# Patient Record
Sex: Male | Born: 1940 | Race: Black or African American | Hispanic: No | Marital: Married | Smoking: Former smoker
Health system: Southern US, Community
[De-identification: ages and names within clinical notes are randomized; demographics above are authoritative.]

## PROBLEM LIST (undated history)

## (undated) DIAGNOSIS — J449 Chronic obstructive pulmonary disease, unspecified: Secondary | ICD-10-CM

## (undated) DIAGNOSIS — E785 Hyperlipidemia, unspecified: Secondary | ICD-10-CM

## (undated) DIAGNOSIS — S065X9A Traumatic subdural hemorrhage with loss of consciousness of unspecified duration, initial encounter: Secondary | ICD-10-CM

## (undated) DIAGNOSIS — N183 Chronic kidney disease, stage 3 unspecified: Secondary | ICD-10-CM

## (undated) DIAGNOSIS — I219 Acute myocardial infarction, unspecified: Secondary | ICD-10-CM

## (undated) DIAGNOSIS — I2510 Atherosclerotic heart disease of native coronary artery without angina pectoris: Secondary | ICD-10-CM

## (undated) DIAGNOSIS — R519 Headache, unspecified: Secondary | ICD-10-CM

## (undated) DIAGNOSIS — I10 Essential (primary) hypertension: Secondary | ICD-10-CM

## (undated) DIAGNOSIS — M109 Gout, unspecified: Secondary | ICD-10-CM

## (undated) DIAGNOSIS — E119 Type 2 diabetes mellitus without complications: Secondary | ICD-10-CM

## (undated) DIAGNOSIS — C61 Malignant neoplasm of prostate: Secondary | ICD-10-CM

## (undated) DIAGNOSIS — M5432 Sciatica, left side: Secondary | ICD-10-CM

## (undated) DIAGNOSIS — M21372 Foot drop, left foot: Secondary | ICD-10-CM

## (undated) DIAGNOSIS — R51 Headache: Secondary | ICD-10-CM

## (undated) HISTORY — DX: Chronic obstructive pulmonary disease, unspecified: J44.9

## (undated) HISTORY — PX: CORONARY ANGIOPLASTY WITH STENT PLACEMENT: SHX49

## (undated) HISTORY — PX: CYSTOSCOPY WITH FULGERATION: SHX6638

## (undated) HISTORY — DX: Atherosclerotic heart disease of native coronary artery without angina pectoris: I25.10

## (undated) HISTORY — DX: Hyperlipidemia, unspecified: E78.5

## (undated) HISTORY — DX: Acute myocardial infarction, unspecified: I21.9

## (undated) HISTORY — DX: Gout, unspecified: M10.9

---

## 2005-02-27 DIAGNOSIS — C61 Malignant neoplasm of prostate: Secondary | ICD-10-CM

## 2005-02-27 HISTORY — PX: PROSTATECTOMY: SHX69

## 2005-02-27 HISTORY — DX: Malignant neoplasm of prostate: C61

## 2015-02-28 HISTORY — PX: CATARACT EXTRACTION W/ INTRAOCULAR LENS  IMPLANT, BILATERAL: SHX1307

## 2015-05-07 ENCOUNTER — Other Ambulatory Visit: Payer: Self-pay | Admitting: Gastroenterology

## 2015-05-07 DIAGNOSIS — R634 Abnormal weight loss: Secondary | ICD-10-CM

## 2015-05-19 ENCOUNTER — Ambulatory Visit
Admission: RE | Admit: 2015-05-19 | Discharge: 2015-05-19 | Disposition: A | Discharge status: Home / Self Care | Payer: Medicare Other | Source: Ambulatory Visit | Attending: Gastroenterology | Admitting: Gastroenterology

## 2015-05-19 DIAGNOSIS — R634 Abnormal weight loss: Secondary | ICD-10-CM

## 2016-04-12 ENCOUNTER — Encounter: Payer: Self-pay | Admitting: Physical Therapy

## 2016-04-12 ENCOUNTER — Ambulatory Visit: Payer: Medicare Other | Attending: Gastroenterology | Admitting: Physical Therapy

## 2016-04-12 DIAGNOSIS — M6281 Muscle weakness (generalized): Secondary | ICD-10-CM | POA: Insufficient documentation

## 2016-04-12 DIAGNOSIS — M62838 Other muscle spasm: Secondary | ICD-10-CM | POA: Diagnosis present

## 2016-04-12 NOTE — Patient Instructions (Signed)
Toileting Techniques for Bowel Movements (Defecation) Using your belly (abdomen) and pelvic floor muscles to have a bowel movement is usually instinctive.  Sometimes people can have problems with these muscles and have to relearn proper defecation (emptying) techniques.  If you have weakness in your muscles, organs that are falling out, decreased sensation in your pelvis, or ignore your urge to go, you may find yourself straining to have a bowel movement.  You are straining if you are: . holding your breath or taking in a huge gulp of air and holding it  . keeping your lips and jaw tensed and closed tightly . turning red in the face because of excessive pushing or forcing . developing or worsening your  hemorrhoids . getting faint while pushing . not emptying completely and have to defecate many times a day  If you are straining, you are actually making it harder for yourself to have a bowel movement.  Many people find they are pulling up with the pelvic floor muscles and closing off instead of opening the anus. Due to lack pelvic floor relaxation and coordination the abdominal muscles, one has to work harder to push the feces out.  Many people have never been taught how to defecate efficiently and effectively.  Notice what happens to your body when you are having a bowel movement.  While you are sitting on the toilet pay attention to the following areas: . Jaw and mouth position . Angle of your hips   . Whether your feet touch the ground or not . Arm placement  . Spine position . Waist . Belly tension . Anus (opening of the anal canal)  An Evacuation/Defecation Plan   Here are the 4 basic points:  1. Lean forward enough for your elbows to rest on your knees 2. Support your feet on the floor or use a low stool if your feet don't touch the floor  3. Push out your belly as if you have swallowed a beach ball-you should feel a widening of your waist 4. Open and relax your pelvic floor muscles,  rather than tightening around the anus      The following conditions my require modifications to your toileting posture:  . If you have had surgery in the past that limits your back, hip, pelvic, knee or ankle flexibility . Constipation   Your healthcare practitioner may make the following additional suggestions and adjustments:  1) Sit on the toilet  a) Make sure your feet are supported. b) Notice your hip angle and spine position-most people find it effective to lean forward or raise their knees, which can help the muscles around the anus to relax  c) When you lean forward, place your forearms on your thighs for support  2) Relax suggestions a) Breath deeply in through your nose and out slowly through your mouth as if you are smelling the flowers and blowing out the candles. b) To become aware of how to relax your muscles, contracting and releasing muscles can be helpful.  Pull your pelvic floor muscles in tightly by using the image of holding back gas, or closing around the anus (visualize making a circle smaller) and lifting the anus up and in.  Then release the muscles and your anus should drop down and feel open. Repeat 5 times ending with the feeling of relaxation. c) Keep your pelvic floor muscles relaxed; let your belly bulge out. d) The digestive tract starts at the mouth and ends at the anal opening, so be   sure to relax both ends of the tube.  Place your tongue on the roof of your mouth with your teeth separated.  This helps relax your mouth and will help to relax the anus at the same time.  3) Empty (defecation) a) Keep your pelvic floor and sphincter relaxed, then bulge your anal muscles.  Make the anal opening wide.  b) Stick your belly out as if you have swallowed a beach ball. c) Make your belly wall hard using your belly muscles while continuing to breathe. Doing this makes it easier to open your anus. d) Breath out and give a grunt (or try using other sounds such as  ahhhh, shhhhh, ohhhh or grrrrrrr).  4) Finish a) As you finish your bowel movement, pull the pelvic floor muscles up and in.  This will leave your anus in the proper place rather than remaining pushed out and down. If you leave your anus pushed out and down, it will start to feel as though that is normal and give you incorrect signals about needing to have a bowel movement.    Jakki Crosser, PT [facility] [address] [place], [place] [address]  

## 2016-04-13 ENCOUNTER — Encounter: Payer: Self-pay | Admitting: Physical Therapy

## 2016-04-17 ENCOUNTER — Encounter: Payer: Self-pay | Admitting: Physical Therapy

## 2016-04-17 NOTE — Therapy (Signed)
[facility] [address], [place], [place], [address] Phone: [phone]   Fax:  [phone]  Physical Therapy Evaluation  Patient Details  Name: Micheal Fox MRN: [id number] Date of Birth: Mar 01, 1940 Referring Provider: Elita Quick, MD  Encounter Date: 04/12/2016      PT End of Session - 04/17/16 1759    Visit Number 1   Number of Visits 10   Date for PT Re-Evaluation 08/02/16   Authorization Type gcodes at 10th visit; KX at 15th visit   PT Start Time 1102   PT Stop Time 1144   PT Time Calculation (min) 42 min   Activity Tolerance Patient tolerated treatment well   Behavior During Therapy [facility] for tasks assessed/performed      Past Medical History:  Diagnosis Date  . CAD (coronary artery disease)   . Cancer [facility])    prostate  . COPD (chronic obstructive pulmonary disease) ([place])   . Diabetes mellitus without complication ([place])   . Gout   . Hyperlipidemia   . Myocardial infarction     Past Surgical History:  Procedure Laterality Date  . PROSTATE SURGERY     prostatectomy about 11 years ago    There were no vitals filed for this visit.       Subjective Assessment - 04/17/16 1758    Subjective Bowel leakage, bladder leakage and pain.  Has sciatic pain going from rectum sometimes down leg all the way to the foot. Had pain about one year.  Urinary leakage 2 years, bowel leakage 6 months. Can't feel any sensation of having to void or have BM.   Patient is accompained by: Family member  wife   Pertinent History prostate cancer, prostatectomy   Limitations Sitting;Standing;Walking   Patient Stated Goals stop leakage and be able to get back to the gym and have intercourse with my wife   Currently in Pain? Yes   Pain Score 8    Pain Location Rectum   Pain Orientation Mid   Pain Descriptors / Indicators Aching   Pain Type Chronic pain   Pain Onset More than a month ago   Pain Frequency  Intermittent   Aggravating Factors  sitting   Pain Relieving Factors aleve, hot water   Effect of Pain on Daily Activities not sure when he is going to have a bowel movement   Multiple Pain Sites No            [facility] PT Assessment - 04/17/16 0001      Assessment   Medical Diagnosis K59.02 Constipation due to outlet dysfunction   Referring Provider McGill, Verline Lema, MD   Prior Therapy no     Precautions   Precautions None     Restrictions   Weight Bearing Restrictions No     Home Environment   Living Environment Private residence   Living Arrangements Spouse/significant other   Type of [place] One level     Prior Function   Level of [place] Retired   Leisure going to the gym before the sciatica started     Cognition   Overall Cognitive Status Within Functional Limits for tasks assessed     Observation/Other Assessments   Focus on Therapeutic Outcomes (FOTO)  55% limited on bowel cnst survey  goal 43% limitation     Posture/Postural Control   Posture/Postural Control Postural limitations   Postural Limitations Rounded Shoulders;Left pelvic obliquity  PROM   Right Hip Internal Rotation  5   Left Hip Flexion 100   Lumbar Flexion 50% limitation   Lumbar Extension 50% limitation     Strength   Right Hip Extension 4-/5   Right Hip External Rotation  4/5   Right Hip Internal Rotation 4/5   Right Hip ABduction 4-/5   Right Hip ADduction 4-/5   Left Hip Extension 4-/5   Left Hip External Rotation 4/5   Left Hip Internal Rotation 4/5   Left Hip ABduction 4-/5   Left Hip ADduction 4-/5     Right Hip   Right Hip Flexion 100     Ambulation/Gait   Gait Pattern Decreased arm swing - right;Decreased arm swing - left;Decreased step length - right;Decreased step length - left                 Pelvic Floor Special Questions - 04/17/16 0001    Prior Pelvic/Prostate Exam Yes   Date of Last Pelvic/Prostate  Exam 03/06/16   Diagnostic Test/Procedure Performed Yes   Type Diagnostic/Procedure PSA will follow up in March to test   Currently Sexually Active No   History of sexually transmitted disease Yes   Marinoff Scale --  inability to achieve   Urinary Leakage Yes   How often all the time   Pad use 3-4   Activities that cause leaking --  nothing increases, just always leaks   Fecal incontinence Yes  checks no sensation   Falling out feeling (prolapse) No   Skin Integrity Intact   Perineal Body/Introitus  Elevated   Prolapse None   Pelvic Floor Internal Exam pt was informed and consent given to manually assess pelvic floor, penile, and scrotum tissues internally and externally   Exam Type Rectal   Sensation decreased, unable to differentiate where tactile cues were given   Palpation tender and muscle spasms transverse peroneus, ischial cavernosus, bulbospongeosus, obdurator internus, coccygeus, and levator muscles   Strength weak squeeze, no lift          [facility] Adult PT Treatment/Exercise - 04/17/16 0001      Self-Care   Self-Care ADL's   ADL's toileting techniques handout and education provided                  PT Short Term Goals - 04/17/16 1739      PT SHORT TERM GOAL #1   Title independent with initial HEP   Time 4   Period Weeks   Status New     PT SHORT TERM GOAL #2   Title able to decrease pad usage to 3 per day   Time 4   Period Weeks   Status New     PT SHORT TERM GOAL #3   Title able to have BM with 50% less difficulty due to ability to relax pelvic floor and demonstrate improved coordination of muscles   Time 4   Period Weeks   Status New     PT SHORT TERM GOAL #4   Title pain reduced by 50% due to decreased muscle spasms in pelvic floor   Time 4   Period Weeks   Status New           PT Long Term Goals - 04/17/16 [id number]      PT LONG TERM GOAL #1   Title FOTO limitation 43% or less   Time 16   Period Weeks   Status New     PT LONG  TERM GOAL #  2   Title independent with advanced HEP   Time 16   Period Weeks   Status New     PT LONG TERM GOAL #3   Title Able to identify the sensation of needing to use the bathroom   Time 16   Period Weeks   Status New     PT LONG TERM GOAL #4   Title able to reduce leakage to use only 2 pads per day.     Time 16   Period Weeks   Status New     PT LONG TERM GOAL #5   Title able to hold urine for 2 hours without assistive device   Time 16   Period Weeks   Status New               Plan - 04/17/16 1747    Clinical Impression Statement Patient presents for moderate complexity eval due to history of prostate cancer and prostectomy.  Patient needed hip, lumbar, pelvic examination and condition is evolving.  Patient has 8/10 pain, lumbar AROM decreased by 50%, continuous bowel and bladder leakage.  Pelvic floor muscle spasms and weakness of 2/5.  lack of coordination is observed with breathing and pelvic floor contractions.  Patient has hip weakness of 4-/5 bilaterally.  He has poor posture with left pelvic obliquity and deecreased trunk rotation and arm swing during gait.  [place] will need skilled PT to address these impairments and time needed due to muscle tissue that has been effected by surgery and radiation treatmnets from previous cancer.   Rehab Potential Excellent   Clinical Impairments Affecting Rehab Potential prostate cancer, prostatectomy, pelvic radiation   PT Frequency 2x / week   PT Duration Other (comment)  16 weeks   PT Treatment/Interventions ADLs/Self Care Home Management;Biofeedback;Cryotherapy;Electrical Stimulation;Moist Heat;Patient/family education;Therapeutic activities;Therapeutic exercise;Manual techniques;Neuromuscular re-education;Dry needling;Taping;Passive range of motion   PT Next Visit Plan toileting techniques, breathing, manual techniques, penile clamp information   Recommended Other Services none   Consulted and Agree with Plan of Care  Patient;Family member/caregiver   Family Member Consulted wife      Patient will benefit from skilled therapeutic intervention in order to improve the following deficits and impairments:  Decreased activity tolerance, Decreased endurance, Decreased strength, Difficulty walking, Pain, Postural dysfunction, Increased muscle spasms  Visit Diagnosis: Muscle weakness (generalized) - Plan: PT plan of care cert/re-cert  Other muscle spasm - Plan: PT plan of care cert/re-cert      G-Codes - [id number]    Functional Assessment Tool Used Clinical judgement   Functional Assessment Tool Used FOTO   Functional Limitation Self care   Self Care Current Status [id number]) At least 60 percent but less than 80 percent impaired, limited or restricted   Self Care Goal Status [id number]) At least 40 percent but less than 60 percent impaired, limited or restricted       Problem List There are no active problems to display for this patient.   Zannie Cove, PT 04/17/2016, 6:03 PM   [facility] [address], [place], [place], [address] Phone: [phone]   Fax:  [phone]  Name: Larence Rabe MRN: [id number] Date of Birth: 12-Jul-1940

## 2016-04-18 ENCOUNTER — Ambulatory Visit: Payer: Medicare Other | Admitting: Physical Therapy

## 2016-04-18 ENCOUNTER — Encounter: Payer: Self-pay | Admitting: Physical Therapy

## 2016-04-18 DIAGNOSIS — M6281 Muscle weakness (generalized): Secondary | ICD-10-CM | POA: Diagnosis not present

## 2016-04-18 DIAGNOSIS — M62838 Other muscle spasm: Secondary | ICD-10-CM

## 2016-04-18 NOTE — Therapy (Signed)
[facility] [address] [place], [place], [address] Phone: [phone]   Fax:  [phone]  Physical Therapy Treatment  Patient Details  Name: Micheal Fox MRN: [id number] Date of Birth: 06-15-1940 Referring Provider: Elita Quick, MD  Encounter Date: 04/18/2016      PT End of Session - 04/18/16 1813    Visit Number 2   Number of Visits 10   Date for PT Re-Evaluation 06/07/16   Authorization Type gcodes at 10th visit; KX at 15th visit   PT Start Time 1101   PT Stop Time 1144   PT Time Calculation (min) 43 min   Activity Tolerance Patient tolerated treatment well   Behavior During Therapy [facility] for tasks assessed/performed      Past Medical History:  Diagnosis Date  . CAD (coronary artery disease)   . Cancer [facility])    prostate  . COPD (chronic obstructive pulmonary disease) ([place])   . Diabetes mellitus without complication ([place])   . Gout   . Hyperlipidemia   . Myocardial infarction     Past Surgical History:  Procedure Laterality Date  . PROSTATE SURGERY     prostatectomy about 11 years ago    There were no vitals filed for this visit.      Subjective Assessment - 04/18/16 1804    Subjective Did not try using toilet techniques yet.  Feeling pain in left leg.  Presented with antalgic gait today.   Pertinent History prostate cancer, prostatectomy   Limitations Sitting;Standing;Walking   Patient Stated Goals stop leakage and be able to get back to the gym and have intercourse with my wife   Currently in Pain? Yes   Pain Score 8    Pain Location Rectum   Pain Orientation Mid   Pain Descriptors / Indicators Aching   Pain Type Chronic pain   Pain Radiating Towards left leg   Pain Onset More than a month ago   Pain Frequency Intermittent   Aggravating Factors  sitting   Pain Relieving Factors aleve   Effect of Pain on Daily Activities BM    Multiple Pain Sites No                          [facility] Adult PT Treatment/Exercise - 04/18/16 0001      Self-Care   ADL's reviewed and practiced toileting techniques with breathing     Therapeutic Activites    Therapeutic Activities ADL's   ADL's stretches with pelvic floor bulging and relaxing in order to be able to improve ability to have bowel movment     Manual Therapy   Manual Therapy Soft tissue mobilization;Joint mobilization   Manual therapy comments side lying on right side   Soft tissue mobilization left QL, lumbar paraspinals, glutes                PT Education - 04/18/16 1142    Education provided Yes   Education Details clamp protocol and stretches   Person(s) Educated Patient   Methods Explanation;Tactile cues;Verbal cues;Handout   Comprehension Verbalized understanding          PT Short Term Goals - 04/17/16 1739      PT SHORT TERM GOAL #1   Title independent with initial HEP   Time 4   Period Weeks   Status New     PT SHORT TERM GOAL #2   Title able to decrease pad usage to 3 per  day   Time 4   Period Weeks   Status New     PT SHORT TERM GOAL #3   Title able to have BM with 50% less difficulty due to ability to relax pelvic floor and demonstrate improved coordination of muscles   Time 4   Period Weeks   Status New     PT SHORT TERM GOAL #4   Title pain reduced by 50% due to decreased muscle spasms in pelvic floor   Time 4   Period Weeks   Status New           PT Long Term Goals - 05-08-16 [id number]      PT LONG TERM GOAL #1   Title FOTO limitation 43% or less   Time 16   Period Weeks   Status New     PT LONG TERM GOAL #2   Title independent with advanced HEP   Time 16   Period Weeks   Status New     PT LONG TERM GOAL #3   Title Able to identify the sensation of needing to use the bathroom   Time 16   Period Weeks   Status New     PT LONG TERM GOAL #4   Title able to reduce leakage to use only 2 pads per day.     Time 16    Period Weeks   Status New     PT LONG TERM GOAL #5   Title able to hold urine for 2 hours without assistive device   Time 16   Period Weeks   Status New               Plan - 04/18/16 1811    Clinical Impression Statement First visit since eval.  Able to feel more change in pelvic floor with breathing and performing toilet techniques correctly with verbal and visual cues.  Patient was educated on penile clamp during manual treatment and given handout information.  patient has tight lumbar and glutes on left side.  Symptoms reduced with tretment today.    Rehab Potential Excellent   Clinical Impairments Affecting Rehab Potential prostate cancer, prostatectomy, pelvic radiation   PT Frequency 2x / week   PT Duration 8 weeks   PT Treatment/Interventions ADLs/Self Care Home Management;Biofeedback;Cryotherapy;Electrical Stimulation;Moist Heat;Patient/family education;Therapeutic activities;Therapeutic exercise;Manual techniques;Neuromuscular re-education;Dry needling;Taping;Passive range of motion   PT Next Visit Plan toileting techniques, breathing, manual techniques, f/u with penile clamp information and stretches   Consulted and Agree with Plan of Care Patient      Patient will benefit from skilled therapeutic intervention in order to improve the following deficits and impairments:  Decreased activity tolerance, Decreased endurance, Decreased strength, Difficulty walking, Pain, Postural dysfunction, Increased muscle spasms  Visit Diagnosis: Muscle weakness (generalized)  Other muscle spasm       G-Codes - 08-May-2016 1801    Functional Assessment Tool Used (Outpatient Only) FOTO   Functional Limitation Self care   Self Care Current Status [id number]) At least 60 percent but less than 80 percent impaired, limited or restricted   Self Care Goal Status [id number]) At least 40 percent but less than 60 percent impaired, limited or restricted   Functional Assessment Tool Used Clinical  judgement      Problem List There are no active problems to display for this patient.   Zannie Cove, PT 04/18/2016, 6:15 PM  [facility] [address] [place], [place], [address] Phone: [phone]  Fax:  [phone]  Name: Micheal Fox MRN: [id number] Date of Birth: [birth year]

## 2016-04-18 NOTE — Patient Instructions (Signed)
Piriformis Stretch    Lying on back, pull right knee toward opposite shoulder. Hold _30___ seconds. Repeat __2__ times. Do _1___ sessions per day.  http://gt2.exer.us/258   Copyright  VHI. All rights reserved.   Piriformis Stretch, Supine    Lie supine, one ankle crossed onto opposite knee. Holding bottom leg behind knee, gently pull legs toward chest until stretch is felt in buttock of top leg. Hold _30__ seconds. For deeper stretch gently push top knee away from body.  Repeat _2__ times per session. Do _2__ sessions per day.  Copyright  VHI. All rights reserved.    Supine Knee-to-Chest, Unilateral    Lie on back, hands clasped behind one knee. Pull knee in toward chest until a comfortable stretch is felt in lower back and buttocks. Hold 30___ seconds.  Repeat __2_ times per session. Do _1__ sessions per day.  Copyright  VHI. All rights reserved.  Supine With Rotation    Lie on back with one knee drawn toward chest. Slowly bring bent leg across body until stretch is felt in lower back area. Hold _30__ seconds. Repeat to other side. Repeat _2__ times per session. Do __2_ sessions per day.  Copyright  VHI. All rights reserved.  Butterfly, Supine    Lie on back, feet together. Lower knees toward floor. Hold 60___ seconds. Repeat _2__ times per session. Do _1__ sessions per day.  Copyright  VHI. All rights reserved.      Clamp Protocol 1.  Fit penile clamp-Weisner or [employer] 2. Wear all day  during the waking hours ONLY. Do not wear when sleeping. 3. Place clamp just before the glans penis (head of penis) 4. Wear for a minimum of 2 hours, max 4 hours 5. Wear 6 days/week, with one day off to re-assess 6. Bladder Chart on the day off 7. On day off see how long you can go without leaking but continue urinating 2 hours  8. When weaning off the clamp wear a pad just in case of leakage. 9. Wear 4-6 weeks, , wean off pads 10. May use in combination of  medication that doctor may give you         [facility] [address], [place], [place] [address] Phone # [phone] Fax [phone]

## 2016-04-20 ENCOUNTER — Ambulatory Visit: Payer: Medicare Other | Admitting: Physical Therapy

## 2016-04-20 ENCOUNTER — Encounter: Payer: Self-pay | Admitting: Physical Therapy

## 2016-04-20 DIAGNOSIS — M6281 Muscle weakness (generalized): Secondary | ICD-10-CM | POA: Diagnosis not present

## 2016-04-20 DIAGNOSIS — M62838 Other muscle spasm: Secondary | ICD-10-CM

## 2016-04-20 NOTE — Therapy (Signed)
[facility] [address], [place], [place], [address] Phone: [phone]   Fax:  [phone]  Physical Therapy Treatment  Patient Details  Name: Micheal Fox MRN: [id number] Date of Birth: March 31, 1940 Referring Provider: Elita Quick, MD  Encounter Date: 04/20/2016      PT End of Session - 04/20/16 1320    Visit Number 3   Number of Visits 10   Date for PT Re-Evaluation 06/07/16   Authorization Type gcodes at 10th visit; KX at 15th visit   PT Start Time 1234   PT Stop Time 1320   PT Time Calculation (min) 46 min   Activity Tolerance Patient tolerated treatment well   Behavior During Therapy [facility] for tasks assessed/performed      Past Medical History:  Diagnosis Date  . CAD (coronary artery disease)   . Cancer [facility])    prostate  . COPD (chronic obstructive pulmonary disease) ([place])   . Diabetes mellitus without complication ([place])   . Gout   . Hyperlipidemia   . Myocardial infarction     Past Surgical History:  Procedure Laterality Date  . PROSTATE SURGERY     prostatectomy about 11 years ago    There were no vitals filed for this visit.      Subjective Assessment - 04/20/16 1321    Subjective Felt better than he has in a year after previous treatment.  Continues to have left side antalgic gait.   Pertinent History prostate cancer, prostatectomy   Limitations Sitting;Standing;Walking   Patient Stated Goals stop leakage and be able to get back to the gym and have intercourse with my wife   Currently in Pain? Yes   Pain Score 6    Pain Location Hip   Pain Orientation Left   Pain Descriptors / Indicators Aching   Pain Type Chronic pain   Pain Onset More than a month ago   Multiple Pain Sites No                         [facility] Adult PT Treatment/Exercise - 04/20/16 0001      Neuro Re-ed    Neuro Re-ed Details  glute med muscle activity, breathing with ribcage mobs for greater  diaphragm movmement     Knee/Hip Exercises: Stretches   Active Hamstring Stretch Both;30 seconds   Piriformis Stretch Both;2 reps;30 seconds     Manual Therapy   Manual Therapy Joint mobilization   Joint Mobilization hip A/P, P/A, and long axis distraction 5 x 10 sec   Soft tissue mobilization left QL, lumbar paraspinals, glutes                PT Education - 04/20/16 1320    Education provided Yes   Education Details breathing   Person(s) Educated Patient   Methods Explanation;Demonstration;Tactile cues;Verbal cues;Handout   Comprehension Verbalized understanding;Returned demonstration          PT Short Term Goals - 04/20/16 1321      PT SHORT TERM GOAL #1   Title independent with initial HEP   Time 4   Period Weeks   Status On-going     PT SHORT TERM GOAL #2   Title able to decrease pad usage to 3 per day   Time 4   Period Weeks   Status On-going     PT SHORT TERM GOAL #3   Title able to have BM with 50% less difficulty due to ability  to relax pelvic floor and demonstrate improved coordination of muscles   Time 4   Period Weeks   Status On-going     PT SHORT TERM GOAL #4   Title pain reduced by 50% due to decreased muscle spasms in pelvic floor   Time 4   Period Weeks   Status On-going           PT Long Term Goals - 04/17/16 [id number]      PT LONG TERM GOAL #1   Title FOTO limitation 43% or less   Time 16   Period Weeks   Status New     PT LONG TERM GOAL #2   Title independent with advanced HEP   Time 16   Period Weeks   Status New     PT LONG TERM GOAL #3   Title Able to identify the sensation of needing to use the bathroom   Time 16   Period Weeks   Status New     PT LONG TERM GOAL #4   Title able to reduce leakage to use only 2 pads per day.     Time 16   Period Weeks   Status New     PT LONG TERM GOAL #5   Title able to hold urine for 2 hours without assistive device   Time 16   Period Weeks   Status New                Plan - 04/20/16 1324    Clinical Impression Statement Able to get more ribcage mobility with strong tactile cues, needed a lot of cues for breathing in through nose and correct muscle patterns.  Continues to have left hip tension.  Performed clams to activate glute med for improved muscle activation during gait. Skilled PT needed for reduced stiffness and muscle spasms and improve muscle coordination during activities   Rehab Potential Excellent   Clinical Impairments Affecting Rehab Potential prostate cancer, prostatectomy, pelvic radiation   PT Treatment/Interventions ADLs/Self Care Home Management;Biofeedback;Cryotherapy;Electrical Stimulation;Moist Heat;Patient/family education;Therapeutic activities;Therapeutic exercise;Manual techniques;Neuromuscular re-education;Dry needling;Taping;Passive range of motion   PT Next Visit Plan f/u on breathing, manual techniques, f/u with penile clamp information and stretches   Consulted and Agree with Plan of Care Patient      Patient will benefit from skilled therapeutic intervention in order to improve the following deficits and impairments:  Decreased activity tolerance, Decreased endurance, Decreased strength, Difficulty walking, Pain, Postural dysfunction, Increased muscle spasms  Visit Diagnosis: Muscle weakness (generalized)  Other muscle spasm     Problem List There are no active problems to display for this patient.   Zannie Cove, PT 04/20/2016, 1:30 PM  [facility] [address] [place], [place], [address] Phone: [phone]   Fax:  [phone]  Name: Micheal Fox MRN: [id number] Date of Birth: Nov 14, 1940

## 2016-04-20 NOTE — Patient Instructions (Signed)
Diaphragmatic - Supine    Inhale through nose making navel move out toward hands. Expand the the ribcage on the inhale.  Exhale through puckered lips, hands follow navel in. Repeat __5_ times.  Do _2__ times per day.  Copyright  VHI. All rights reserved.

## 2016-04-25 ENCOUNTER — Ambulatory Visit: Payer: Medicare Other | Admitting: Physical Therapy

## 2016-04-25 ENCOUNTER — Encounter: Payer: Self-pay | Admitting: Physical Therapy

## 2016-04-25 DIAGNOSIS — M6281 Muscle weakness (generalized): Secondary | ICD-10-CM | POA: Diagnosis not present

## 2016-04-25 DIAGNOSIS — M62838 Other muscle spasm: Secondary | ICD-10-CM

## 2016-04-25 NOTE — Therapy (Signed)
[facility] [address], [place], [place], [address] Phone: [phone]   Fax:  [phone]  Physical Therapy Treatment  Patient Details  Name: Micheal Fox MRN: [id number] Date of Birth: [birth year] Referring Provider: Elita Quick, MD  Encounter Date: 04/25/2016      PT End of Session - 04/25/16 1051    Visit Number 4   Number of Visits 10   Date for PT Re-Evaluation 06/07/16   Authorization Type gcodes at 10th visit; KX at 15th visit   PT Start Time 1053   PT Stop Time 1136   PT Time Calculation (min) 43 min   Activity Tolerance Patient tolerated treatment well   Behavior During Therapy [facility] for tasks assessed/performed      Past Medical History:  Diagnosis Date  . CAD (coronary artery disease)   . Cancer [facility])    prostate  . COPD (chronic obstructive pulmonary disease) ([place])   . Diabetes mellitus without complication ([place])   . Gout   . Hyperlipidemia   . Myocardial infarction     Past Surgical History:  Procedure Laterality Date  . PROSTATE SURGERY     prostatectomy about 11 years ago    There were no vitals filed for this visit.      Subjective Assessment - 04/25/16 1052    Subjective States having less pain in his leg.  Almost a whole day before having pain.  States his pain is low today, took pain pill and aleve.  Started using clamp and is still working on getting it adjusted so it is the correct tightness.   Pertinent History prostate cancer, prostatectomy   Currently in Pain? Yes                         [facility] Adult PT Treatment/Exercise - 04/25/16 0001      Neuro Re-ed    Neuro Re-ed Details  pelvic floor contractions coordinating with breathing and diaphragm activity     Lumbar Exercises: Stretches   Single Knee to Chest Stretch 3 reps;10 seconds   Piriformis Stretch 3 reps;10 seconds     Manual Therapy   Manual therapy comments supine; pt informed and  consent given to peform internal soft tissue mobs to anterior and posterior pelvic floor   Soft tissue mobilization bilateral transverse peroneus, levators, obdurators, ischial covernosis, hip adductors                  PT Short Term Goals - 04/25/16 1144      PT SHORT TERM GOAL #1   Title independent with initial HEP   Time 4   Period Weeks   Status Achieved     PT SHORT TERM GOAL #2   Title able to decrease pad usage to 3 per day   Time 4   Period Weeks   Status On-going     PT SHORT TERM GOAL #3   Title able to have BM with 50% less difficulty due to ability to relax pelvic floor and demonstrate improved coordination of muscles   Time 4   Period Weeks   Status On-going     PT SHORT TERM GOAL #4   Title pain reduced by 50% due to decreased muscle spasms in pelvic floor   Period Weeks   Status On-going           PT Long Term Goals - 04/25/16 1143      PT LONG  TERM GOAL #1   Title FOTO limitation 43% or less   Time 16   Period Weeks   Status On-going     PT LONG TERM GOAL #2   Title independent with advanced HEP   Time 16   Period Weeks   Status On-going     PT LONG TERM GOAL #3   Title Able to identify the sensation of needing to use the bathroom   Time 16   Period Weeks   Status On-going     PT LONG TERM GOAL #4   Title able to reduce leakage to use only 2 pads per day.     Time 16   Period Weeks   Status On-going     PT LONG TERM GOAL #5   Title able to hold urine for 2 hours without assistive device   Time 16   Period Weeks   Status On-going               Plan - 04/25/16 1051    Clinical Impression Statement Patient demonstrates more relaxation of pelvic floor with decreased muscle spasms and decreased tenderness to palpation.  Left OI was a little tight but not tender. Patient was able to begin pelvic floor strengthening.  Continues to need skilled PT for ROm and strengthening in order to reduce symptoms so he can return to  functional ADLs.   Rehab Potential Excellent   Clinical Impairments Affecting Rehab Potential prostate cancer, prostatectomy, pelvic radiation   PT Treatment/Interventions ADLs/Self Care Home Management;Biofeedback;Cryotherapy;Electrical Stimulation;Moist Heat;Patient/family education;Therapeutic activities;Therapeutic exercise;Manual techniques;Neuromuscular re-education;Dry needling;Taping;Passive range of motion   PT Next Visit Plan f/u on pelvic floor contraction, add sitting on ball, biofeedback, f/u on progress with clamp   Consulted and Agree with Plan of Care Patient      Patient will benefit from skilled therapeutic intervention in order to improve the following deficits and impairments:  Decreased activity tolerance, Decreased endurance, Decreased strength, Difficulty walking, Pain, Postural dysfunction, Increased muscle spasms  Visit Diagnosis: Muscle weakness (generalized)  Other muscle spasm     Problem List There are no active problems to display for this patient.   Zannie Cove, PT 04/25/2016, 11:45 AM  [facility] [address] [place], [place], [address] Phone: [phone]   Fax:  [phone]  Name: Micheal Fox MRN: [id number] Date of Birth: 01-26-1941

## 2016-04-27 ENCOUNTER — Ambulatory Visit: Payer: Medicare Other | Attending: Gastroenterology | Admitting: Physical Therapy

## 2016-04-27 ENCOUNTER — Encounter: Payer: Self-pay | Admitting: Physical Therapy

## 2016-04-27 DIAGNOSIS — M6281 Muscle weakness (generalized): Secondary | ICD-10-CM

## 2016-04-27 DIAGNOSIS — M62838 Other muscle spasm: Secondary | ICD-10-CM | POA: Insufficient documentation

## 2016-04-27 NOTE — Therapy (Signed)
[facility] [address] [place], [place], [address] Phone: [phone]   Fax:  [phone]  Physical Therapy Treatment  Patient Details  Name: Micheal Fox MRN: [id number] Date of Birth: Jul 03, 1940 Referring Provider: Elita Quick, MD  Encounter Date: 04/27/2016      PT End of Session - 04/27/16 0916    Visit Number 5   Number of Visits 10   Date for PT Re-Evaluation 06/07/16   Authorization Type gcodes at 10th visit; KX at 15th visit   PT Start Time 0920   PT Stop Time 1011   PT Time Calculation (min) 51 min   Activity Tolerance Patient tolerated treatment well   Behavior During Therapy [facility] for tasks assessed/performed      Past Medical History:  Diagnosis Date  . CAD (coronary artery disease)   . Cancer [facility])    prostate  . COPD (chronic obstructive pulmonary disease) ([place])   . Diabetes mellitus without complication ([place])   . Gout   . Hyperlipidemia   . Myocardial infarction     Past Surgical History:  Procedure Laterality Date  . PROSTATE SURGERY     prostatectomy about 11 years ago    There were no vitals filed for this visit.      Subjective Assessment - 04/27/16 0924    Subjective Left leg was giving me trouble last night when walking around then when he sat down to relax come on really strong and took pain medicine to relieve. States he bowels did pretty good yesterday.  He just relaxed and was able to have BM without pushing   Currently in Pain? No/denies                         [facility] Adult PT Treatment/Exercise - 04/27/16 0001      Therapeutic Activites    Therapeutic Activities ADL's   ADL's functional pelvic floor contracting for improved control for toileting with reduced leakage.  done in supine with tactile feedback and sitting on ball getting tactile feedback     Manual Therapy   Manual Therapy Soft tissue mobilization;Myofascial release   Manual therapy  comments abdominal massage in supine, rectus abdominus, obliques, QL, psoas, diaphragm   Joint Mobilization hip A/P, P/A, and long axis distraction 5 x 10 sec   Myofascial Release release of incision on abdomen, bladder diaphragm release.                  PT Short Term Goals - 04/25/16 1144      PT SHORT TERM GOAL #1   Title independent with initial HEP   Time 4   Period Weeks   Status Achieved     PT SHORT TERM GOAL #2   Title able to decrease pad usage to 3 per day   Time 4   Period Weeks   Status On-going     PT SHORT TERM GOAL #3   Title able to have BM with 50% less difficulty due to ability to relax pelvic floor and demonstrate improved coordination of muscles   Time 4   Period Weeks   Status On-going     PT SHORT TERM GOAL #4   Title pain reduced by 50% due to decreased muscle spasms in pelvic floor   Period Weeks   Status On-going           PT Long Term Goals - 04/25/16 1143  PT LONG TERM GOAL #1   Title FOTO limitation 43% or less   Time 16   Period Weeks   Status On-going     PT LONG TERM GOAL #2   Title independent with advanced HEP   Time 16   Period Weeks   Status On-going     PT LONG TERM GOAL #3   Title Able to identify the sensation of needing to use the bathroom   Time 16   Period Weeks   Status On-going     PT LONG TERM GOAL #4   Title able to reduce leakage to use only 2 pads per day.     Time 16   Period Weeks   Status On-going     PT LONG TERM GOAL #5   Title able to hold urine for 2 hours without assistive device   Time 16   Period Weeks   Status On-going               Plan - 04/27/16 0916    Clinical Impression Statement Able to progress to pelvic floor strengthening and added to HEP.  Patient was able to have BM without straining due to improved ability to relax pelvic floor,  He is still getting rectal pain that shoots down the Left leg.     Rehab Potential Excellent   Clinical Impairments Affecting  Rehab Potential prostate cancer, prostatectomy, pelvic radiation   PT Treatment/Interventions ADLs/Self Care Home Management;Biofeedback;Cryotherapy;Electrical Stimulation;Moist Heat;Patient/family education;Therapeutic activities;Therapeutic exercise;Manual techniques;Neuromuscular re-education;Dry needling;Taping;Passive range of motion   PT Next Visit Plan f/u on pelvic floor contraction added to HEP, biofeedback, LE strengthening   Consulted and Agree with Plan of Care Patient      Patient will benefit from skilled therapeutic intervention in order to improve the following deficits and impairments:  Decreased activity tolerance, Decreased endurance, Decreased strength, Difficulty walking, Pain, Postural dysfunction, Increased muscle spasms  Visit Diagnosis: Muscle weakness (generalized)  Other muscle spasm     Problem List There are no active problems to display for this patient.   Zannie Cove, PT 04/27/2016, 10:24 AM  [facility] [address], [place], [place], [address] Phone: [phone]   Fax:  [phone]  Name: Micheal Fox MRN: [id number] Date of Birth: 04/27/1940

## 2016-05-04 ENCOUNTER — Ambulatory Visit: Payer: Medicare Other | Admitting: Physical Therapy

## 2016-05-04 ENCOUNTER — Encounter: Payer: Self-pay | Admitting: Physical Therapy

## 2016-05-04 DIAGNOSIS — M6281 Muscle weakness (generalized): Secondary | ICD-10-CM | POA: Diagnosis not present

## 2016-05-04 DIAGNOSIS — M62838 Other muscle spasm: Secondary | ICD-10-CM

## 2016-05-04 NOTE — Therapy (Signed)
[facility] [address] [place], [place], [address] Phone: [phone]   Fax:  [phone]  Physical Therapy Treatment  Patient Details  Name: Micheal Fox MRN: [id number] Date of Birth: [birth year] Referring Provider: Elita Quick, MD  Encounter Date: 05/04/2016      PT End of Session - 05/04/16 1153    Visit Number 6   Number of Visits 10   Date for PT Re-Evaluation 06/07/16   Authorization Type gcodes at 10th visit; KX at 15th visit   PT Start Time 1101   PT Stop Time 1150   PT Time Calculation (min) 49 min   Activity Tolerance Patient tolerated treatment well   Behavior During Therapy [facility] for tasks assessed/performed      Past Medical History:  Diagnosis Date  . CAD (coronary artery disease)   . Cancer [facility])    prostate  . COPD (chronic obstructive pulmonary disease) ([place])   . Diabetes mellitus without complication ([place])   . Gout   . Hyperlipidemia   . Myocardial infarction     Past Surgical History:  Procedure Laterality Date  . PROSTATE SURGERY     prostatectomy about 11 years ago    There were no vitals filed for this visit.      Subjective Assessment - 05/04/16 1103    Subjective States feeling better overall.Still no feeling of when he has to have BM or pee.   Pertinent History prostate cancer, prostatectomy   Patient Stated Goals stop leakage and be able to get back to the gym and have intercourse with my wife   Currently in Pain? Yes   Pain Score 5    Pain Location Hip   Pain Orientation Left   Pain Radiating Towards rectum down left leg to the knee   Pain Onset More than a month ago   Pain Frequency Intermittent   Multiple Pain Sites No                         [facility] Adult PT Treatment/Exercise - 05/04/16 0001      Knee/Hip Exercises: Supine   Bridges Strengthening;Both;3 sets;10 reps   Other Supine Knee/Hip Exercises clam red band - 20x, hook lying  abduction with ab bracing - 20x each     Manual Therapy   Manual Therapy Soft tissue mobilization;Myofascial release   Myofascial Release plantar surface of foot, calf peroneals, lumbar spine, sacral mobs                PT Education - 05/04/16 1150    Education provided Yes   Education Details hip abduction and bridges   Person(s) Educated Patient   Methods Explanation;Demonstration;Tactile cues;Verbal cues;Handout   Comprehension Verbalized understanding;Returned demonstration          PT Short Term Goals - 04/25/16 1144      PT SHORT TERM GOAL #1   Title independent with initial HEP   Time 4   Period Weeks   Status Achieved     PT SHORT TERM GOAL #2   Title able to decrease pad usage to 3 per day   Time 4   Period Weeks   Status On-going     PT SHORT TERM GOAL #3   Title able to have BM with 50% less difficulty due to ability to relax pelvic floor and demonstrate improved coordination of muscles   Time 4   Period Weeks   Status On-going  PT SHORT TERM GOAL #4   Title pain reduced by 50% due to decreased muscle spasms in pelvic floor   Period Weeks   Status On-going           PT Long Term Goals - 05/04/16 1347      PT LONG TERM GOAL #2   Title independent with advanced HEP   Time 16   Period Weeks   Status On-going     PT LONG TERM GOAL #5   Title able to hold urine for 2 hours without assistive device   Time 16   Period Weeks   Status On-going               Plan - 05/04/16 1344    Clinical Impression Statement Able to progress hip strengthening.  Release of fascia on plantar foot.  There was less tension noticed in low back and Left hip today.  Pelvic obliquity was corrected by 50% with MET.  Patient continues to need skilled PT for improved postural alignment and muscle function/coordination.   Rehab Potential Excellent   Clinical Impairments Affecting Rehab Potential prostate cancer, prostatectomy, pelvic radiation   PT  Treatment/Interventions ADLs/Self Care Home Management;Biofeedback;Cryotherapy;Electrical Stimulation;Moist Heat;Patient/family education;Therapeutic activities;Therapeutic exercise;Manual techniques;Neuromuscular re-education;Dry needling;Taping;Passive range of motion   PT Next Visit Plan biofeedback, f/u with strengthening exercises and pelvic rotation   Consulted and Agree with Plan of Care Patient      Patient will benefit from skilled therapeutic intervention in order to improve the following deficits and impairments:  Decreased activity tolerance, Decreased endurance, Decreased strength, Difficulty walking, Pain, Postural dysfunction, Increased muscle spasms  Visit Diagnosis: Muscle weakness (generalized)  Other muscle spasm     Problem List There are no active problems to display for this patient.   Zannie Cove, PT 05/04/2016, 3:14 PM  [facility] [address], [place], [place], [address] Phone: [phone]   Fax:  [phone]  Name: Micheal Fox MRN: [id number] Date of Birth: May 31, 1940

## 2016-05-04 NOTE — Patient Instructions (Signed)
Bracing With Bridging (Hook-Lying)    With neutral spine, tighten pelvic floor and abdominals and hold. Lift bottom. Repeat ___ times. Do ___ times a day.   Copyright  VHI. All rights reserved.   Hip Abduction / Adduction: with Knee Flexion (Supine)    Do this with both knees bend and band tied around knees.  Both legs out to the side, then repeat doing one leg at a time.  Can also do With knees bent, gently lower knee to side and return. Keep abdominals engages to brace and don't let pelvis roll back and forth Repeat __10__ times for each exercise . Do __2__ sets per session. Do _1___ sessions per day.  http://orth.exer.us/682   Copyright  VHI. All rights reserved.

## 2016-05-09 ENCOUNTER — Encounter: Payer: Medicare Other | Admitting: Physical Therapy

## 2016-05-10 ENCOUNTER — Ambulatory Visit: Payer: Medicare Other | Admitting: Physical Therapy

## 2016-05-10 ENCOUNTER — Encounter: Payer: Self-pay | Admitting: Physical Therapy

## 2016-05-10 DIAGNOSIS — M62838 Other muscle spasm: Secondary | ICD-10-CM

## 2016-05-10 DIAGNOSIS — M6281 Muscle weakness (generalized): Secondary | ICD-10-CM | POA: Diagnosis not present

## 2016-05-10 NOTE — Therapy (Signed)
[facility] [address], [place], [place], [address] Phone: [phone]   Fax:  [phone]  Physical Therapy Treatment  Patient Details  Name: Micheal Fox MRN: [id number] Date of Birth: 08/25/1940 Referring Provider: Elita Quick, MD  Encounter Date: 05/10/2016      PT End of Session - 05/10/16 1028    Visit Number 7   Number of Visits 10   Date for PT Re-Evaluation 06/07/16   Authorization Type gcodes at 10th visit; KX at 15th visit   PT Start Time 0933   PT Stop Time 1020   PT Time Calculation (min) 47 min   Activity Tolerance Patient tolerated treatment well   Behavior During Therapy [facility] for tasks assessed/performed      Past Medical History:  Diagnosis Date  . CAD (coronary artery disease)   . Cancer [facility])    prostate  . COPD (chronic obstructive pulmonary disease) ([place])   . Diabetes mellitus without complication ([place])   . Gout   . Hyperlipidemia   . Myocardial infarction     Past Surgical History:  Procedure Laterality Date  . PROSTATE SURGERY     prostatectomy about 11 years ago    There were no vitals filed for this visit.      Subjective Assessment - 05/10/16 0947    Subjective Hip was really hurting on Monday.  States feeling a little better overall,  Not using the clamp because it's uncomfortable but uses on long car rides.  Did the exercises one day and no pain as result.   Pertinent History prostate cancer, prostatectomy   Limitations Sitting;Standing;Walking   Patient Stated Goals stop leakage and be able to get back to the gym and have intercourse with my wife   Currently in Pain? Yes   Pain Score 6    Pain Location Rectum   Pain Orientation Left   Pain Descriptors / Indicators Aching   Pain Onset More than a month ago   Aggravating Factors  sitting   Pain Relieving Factors aleve, pain pill   Multiple Pain Sites No                         [facility] Adult  PT Treatment/Exercise - 05/10/16 0001      Knee/Hip Exercises: Supine   Bridges Strengthening;Both;3 sets;10 reps     Knee/Hip Exercises: Sidelying   Clams 15x with PT stabilizing pelvis     Manual Therapy   Manual Therapy Soft tissue mobilization;Joint mobilization   Manual therapy comments abdominal massage in supine, rectus abdominus, obliques, QL, psoas, diaphragm   Joint Mobilization hip A/P, P/A, and long axis distraction 5 x 10 sec   Soft tissue mobilization left QL, lumbar paraspinals, glutes                  PT Short Term Goals - 04/25/16 1144      PT SHORT TERM GOAL #1   Title independent with initial HEP   Time 4   Period Weeks   Status Achieved     PT SHORT TERM GOAL #2   Title able to decrease pad usage to 3 per day   Time 4   Period Weeks   Status On-going     PT SHORT TERM GOAL #3   Title able to have BM with 50% less difficulty due to ability to relax pelvic floor and demonstrate improved coordination of muscles   Time 4  Period Weeks   Status On-going     PT SHORT TERM GOAL #4   Title pain reduced by 50% due to decreased muscle spasms in pelvic floor   Period Weeks   Status On-going           PT Long Term Goals - 05/10/16 1032      PT LONG TERM GOAL #1   Title FOTO limitation 43% or less   Time 16   Period Weeks   Status On-going     PT LONG TERM GOAL #2   Title independent with advanced HEP   Time 16   Period Weeks   Status On-going     PT LONG TERM GOAL #5   Title able to hold urine for 2 hours without assistive device   Time 16   Period Weeks   Status On-going               Plan - 05/10/16 1036    Clinical Impression Statement Patient has significant weakness in Left hip needing strong tactile cues to perform exercises.  Responded well to manual with reduced symptoms.  Patient needs skilled PT for hip strengthening and improved gait in order to reduce symptoms and improve functional activities.   Rehab  Potential Excellent   Clinical Impairments Affecting Rehab Potential prostate cancer, prostatectomy, pelvic radiation   PT Treatment/Interventions ADLs/Self Care Home Management;Biofeedback;Cryotherapy;Electrical Stimulation;Moist Heat;Patient/family education;Therapeutic activities;Therapeutic exercise;Manual techniques;Neuromuscular re-education;Dry needling;Taping;Passive range of motion   PT Next Visit Plan biofeedback, continue strengthening exercises and re-assess for pelvic rotation   Consulted and Agree with Plan of Care Patient      Patient will benefit from skilled therapeutic intervention in order to improve the following deficits and impairments:  Decreased activity tolerance, Decreased endurance, Decreased strength, Difficulty walking, Pain, Postural dysfunction, Increased muscle spasms  Visit Diagnosis: Muscle weakness (generalized)  Other muscle spasm     Problem List There are no active problems to display for this patient.   Zannie Cove, PT 05/10/2016, 10:42 AM  [facility] [address] [place], [place], [address] Phone: [phone]   Fax:  [phone]  Name: Micheal Fox MRN: [id number] Date of Birth: 03-05-1940

## 2016-05-16 ENCOUNTER — Ambulatory Visit: Payer: Medicare Other | Admitting: Physical Therapy

## 2016-05-16 ENCOUNTER — Encounter: Payer: Self-pay | Admitting: Physical Therapy

## 2016-05-16 DIAGNOSIS — M62838 Other muscle spasm: Secondary | ICD-10-CM

## 2016-05-16 DIAGNOSIS — M6281 Muscle weakness (generalized): Secondary | ICD-10-CM

## 2016-05-16 NOTE — Therapy (Signed)
[facility] [address] [place], [place], [address] Phone: [phone]   Fax:  [phone]  Physical Therapy Treatment  Patient Details  Name: Micheal Fox MRN: [id number] Date of Birth: [birth year] Referring Provider: Elita Quick, MD  Encounter Date: 05/16/2016      PT End of Session - 05/16/16 1059    Visit Number 8   Number of Visits 10   Date for PT Re-Evaluation 06/07/16   Authorization Type gcodes at 10th visit; KX at 15th visit   PT Start Time 1059   PT Stop Time 1144   PT Time Calculation (min) 45 min   Activity Tolerance Patient tolerated treatment well   Behavior During Therapy [facility] for tasks assessed/performed      Past Medical History:  Diagnosis Date  . CAD (coronary artery disease)   . Cancer [facility])    prostate  . COPD (chronic obstructive pulmonary disease) ([place])   . Diabetes mellitus without complication ([place])   . Gout   . Hyperlipidemia   . Myocardial infarction     Past Surgical History:  Procedure Laterality Date  . PROSTATE SURGERY     prostatectomy about 11 years ago    There were no vitals filed for this visit.      Subjective Assessment - 05/16/16 1151    Subjective Had increased pain this weekend after doing more exercises.  States foot and hip hasn't felt that bad in a while.     Pertinent History prostate cancer, prostatectomy   Currently in Pain? Yes   Pain Score 8    Pain Location Rectum   Pain Orientation Left   Pain Descriptors / Indicators Aching   Pain Type Chronic pain   Pain Radiating Towards down to toes   Pain Onset More than a month ago   Pain Frequency Intermittent   Aggravating Factors  sitting, walking   Multiple Pain Sites No                         [facility] Adult PT Treatment/Exercise - 05/16/16 0001      Lumbar Exercises: Stretches   Standing Extension 3 reps;20 seconds  hip flexor stretch and supine happy baby     Manual  Therapy   Manual Therapy Soft tissue mobilization;Internal Pelvic Floor;Joint mobilization   Soft tissue mobilization left QL, lumbar paraspinals, glutes   Internal Pelvic Floor patient informed of internal massage and consent given to                 PT Education - 05/16/16 1145    Education provided Yes   Education Details knees to chest with knees apart; hip flexor stretch standing   Person(s) Educated Patient   Methods Explanation;Demonstration;Tactile cues;Verbal cues;Handout   Comprehension Verbalized understanding;Returned demonstration          PT Short Term Goals - 05/16/16 1153      PT SHORT TERM GOAL #3   Title able to have BM with 50% less difficulty due to ability to relax pelvic floor and demonstrate improved coordination of muscles   Time 4   Period Weeks   Status On-going     PT SHORT TERM GOAL #4   Title pain reduced by 50% due to decreased muscle spasms in pelvic floor   Time 4   Period Weeks   Status On-going           PT Long Term Goals - 05/10/16  [place] #1   Title FOTO limitation 43% or less   Time 16   Period Weeks   Status On-going     PT LONG TERM GOAL #2   Title independent with advanced HEP   Time 16   Period Weeks   Status On-going     PT LONG TERM GOAL #5   Title able to hold urine for 2 hours without assistive device   Time 16   Period Weeks   Status On-going               Plan - 05/16/16 1059    Clinical Impression Statement Patient has a lot of tenderness and muscle spasms of levator ani muscles and coccygeus.  Lumbar spine mobs performed for increased mobility.  Pt demonstrates a lot of stiffness. Pt continues to need skilled PT for reduced muscle spasms and increased strength once he is able to be able to relax muscles.   Rehab Potential Excellent   Clinical Impairments Affecting Rehab Potential prostate cancer, prostatectomy, pelvic radiation   PT Treatment/Interventions ADLs/Self Care  Home Management;Biofeedback;Cryotherapy;Electrical Stimulation;Moist Heat;Patient/family education;Therapeutic activities;Therapeutic exercise;Manual techniques;Neuromuscular re-education;Dry needling;Taping;Passive range of motion   PT Next Visit Plan biofeedback, review hip flexor and knee to chest stretch, and re-assess for pelvic rotation, STM to coccygeus and levator muscles   Recommended Other Services none   Consulted and Agree with Plan of Care Patient      Patient will benefit from skilled therapeutic intervention in order to improve the following deficits and impairments:  Decreased activity tolerance, Decreased endurance, Decreased strength, Difficulty walking, Pain, Postural dysfunction, Increased muscle spasms  Visit Diagnosis: Muscle weakness (generalized)  Other muscle spasm     Problem List There are no active problems to display for this patient.   Zannie Cove, PT 05/16/2016, 11:59 AM  [facility] [address], [place], [place], [address] Phone: [phone]   Fax:  [phone]  Name: Raymundo Rout MRN: [id number] Date of Birth: 01/12/1941

## 2016-05-16 NOTE — Patient Instructions (Signed)
Double Knee to Chest (Flexion)    Gently pull both knees toward chest with knees apart. Feel stretch in lower back or buttock area. Breathing deeply, Hold __30__ seconds. Repeat __3__ times. Do __2__ sessions per day.  http://gt2.exer.us/227   Copyright  VHI. All rights reserved.   Hip Flexor: Lean (Standing)    Stand with left foot knee-high surface. Slightly lean pelvis forward until stretch is felt in hip. Hold _30__ seconds. Relax. Repeat _3__ times. Do _1__ times a day. Repeat with other leg.  Copyright  VHI. All rights reserved.

## 2016-05-23 ENCOUNTER — Encounter: Payer: Self-pay | Admitting: Physical Therapy

## 2016-05-23 ENCOUNTER — Ambulatory Visit: Payer: Medicare Other | Admitting: Physical Therapy

## 2016-05-23 DIAGNOSIS — M6281 Muscle weakness (generalized): Secondary | ICD-10-CM | POA: Diagnosis not present

## 2016-05-23 DIAGNOSIS — M62838 Other muscle spasm: Secondary | ICD-10-CM

## 2016-05-23 NOTE — Patient Instructions (Signed)
Balloon Breath    Place hands LIGHTLY on belly below navel. Imagine a balloon inside belly. Blow up balloon on breath IN, deflate balloon on breath OUT. Contract abdominals slightly to assist breath OUT. Time _3__ minutes.  Copyright  VHI. All rights reserved.    Start walking more, 10 minutes at a time (or start where you can and build up to 10 minutes) - do 3x/eay   [facility] [address], [place], [place] [address] Phone # [phone] Fax [phone]

## 2016-05-23 NOTE — Therapy (Signed)
[facility] [address] [place], [place], [address] Phone: [phone]   Fax:  [phone]  Physical Therapy Treatment  Patient Details  Name: Micheal Fox MRN: [id number] Date of Birth: 10-21-1940 Referring Provider: Elita Quick, MD  Encounter Date: 05/23/2016      PT End of Session - 05/23/16 1212    Visit Number 9   Number of Visits 10   Date for PT Re-Evaluation 06/07/16   Authorization Type gcodes at 10th visit; KX at 15th visit   PT Start Time 1101   PT Stop Time 1147   PT Time Calculation (min) 46 min   Activity Tolerance Patient tolerated treatment well   Behavior During Therapy [facility] for tasks assessed/performed      Past Medical History:  Diagnosis Date  . CAD (coronary artery disease)   . Cancer [facility])    prostate  . COPD (chronic obstructive pulmonary disease) ([place])   . Diabetes mellitus without complication ([place])   . Gout   . Hyperlipidemia   . Myocardial infarction     Past Surgical History:  Procedure Laterality Date  . PROSTATE SURGERY     prostatectomy about 11 years ago    There were no vitals filed for this visit.      Subjective Assessment - 05/23/16 1105    Subjective I've been having pain.  I am planning on doing some more walking.   Limitations Sitting;Standing;Walking   Currently in Pain? Yes   Pain Score 5   with pain medicine   Pain Location Rectum   Pain Orientation Left   Pain Descriptors / Indicators Aching   Pain Type Chronic pain   Pain Onset More than a month ago   Pain Frequency Intermittent   Aggravating Factors  sitting, walking   Pain Relieving Factors pain medicine   Effect of Pain on Daily Activities BM, walking   Multiple Pain Sites No                         [facility] Adult PT Treatment/Exercise - 05/23/16 0001      Neuro Re-ed    Neuro Re-ed Details  balloon breathing     Manual Therapy   Manual Therapy Joint  mobilization;Soft tissue mobilization;Myofascial release   Joint Mobilization sacral rotation left, L5 rotation right, left lumbar gapping   Soft tissue mobilization bilateral psoas, left QL, lumbar paraspinals, glutes   Myofascial Release abdominal and diaphragm                PT Education - 05/23/16 1147    Education provided Yes   Education Details balloon breathing and walking   Person(s) Educated Patient   Methods Explanation;Demonstration;Tactile cues;Verbal cues;Handout   Comprehension Verbalized understanding;Returned demonstration          PT Short Term Goals - 05/16/16 1153      PT SHORT TERM GOAL #3   Title able to have BM with 50% less difficulty due to ability to relax pelvic floor and demonstrate improved coordination of muscles   Time 4   Period Weeks   Status On-going     PT SHORT TERM GOAL #4   Title pain reduced by 50% due to decreased muscle spasms in pelvic floor   Time 4   Period Weeks   Status On-going           PT Long Term Goals - 05/23/16 1219      PT  LONG TERM GOAL #1   Title FOTO limitation 43% or less   Time 16   Period Weeks   Status On-going     PT LONG TERM GOAL #2   Title independent with advanced HEP   Time 16   Period Weeks   Status On-going     PT LONG TERM GOAL #3   Title Able to identify the sensation of needing to use the bathroom   Time 16   Period Weeks   Status On-going     PT LONG TERM GOAL #4   Title able to reduce leakage to use only 2 pads per day.     Time 16   Period Weeks   Status On-going     PT LONG TERM GOAL #5   Title able to hold urine for 2 hours without assistive device   Time 16   Period Weeks   Status On-going               Plan - 05/23/16 1051    Clinical Impression Statement Educated in walking more and balloon breathing.  Pt continues to have soft tissue adhesions in lumbopelvic and abdominal regions.  Neuro re-ed for improved muscle coordination is needed for improved  stabilty and pelvic floor function.   Rehab Potential Excellent   Clinical Impairments Affecting Rehab Potential prostate cancer, prostatectomy, pelvic radiation   PT Treatment/Interventions ADLs/Self Care Home Management;Biofeedback;Cryotherapy;Electrical Stimulation;Moist Heat;Patient/family education;Therapeutic activities;Therapeutic exercise;Manual techniques;Neuromuscular re-education;Dry needling;Taping;Passive range of motion   PT Next Visit Plan f/u with leg length difference, hip flexor stretch, STM coccygeus and levator   Consulted and Agree with Plan of Care Patient      Patient will benefit from skilled therapeutic intervention in order to improve the following deficits and impairments:  Decreased activity tolerance, Decreased endurance, Decreased strength, Difficulty walking, Pain, Postural dysfunction, Increased muscle spasms  Visit Diagnosis: Muscle weakness (generalized)  Other muscle spasm     Problem List There are no active problems to display for this patient.   Zannie Cove, PT 05/23/2016, 8:34 PM  [facility] [address] [place], [place], [address] Phone: [phone]   Fax:  [phone]  Name: Micheal Fox MRN: [id number] Date of Birth: [birth year]

## 2016-05-31 ENCOUNTER — Ambulatory Visit: Payer: Medicare Other | Attending: Gastroenterology | Admitting: Physical Therapy

## 2016-05-31 ENCOUNTER — Encounter: Payer: Self-pay | Admitting: Physical Therapy

## 2016-05-31 DIAGNOSIS — M6281 Muscle weakness (generalized): Secondary | ICD-10-CM

## 2016-05-31 DIAGNOSIS — M62838 Other muscle spasm: Secondary | ICD-10-CM | POA: Diagnosis present

## 2016-05-31 NOTE — Therapy (Signed)
[facility] [address], [place], [place], [address] Phone: [phone]   Fax:  [phone]  Physical Therapy Treatment  Patient Details  Name: Micheal Fox MRN: [id number] Date of Birth: [birth year] Referring Provider: Elita Quick, MD  Encounter Date: 05/31/2016      PT End of Session - 05/31/16 1403    Visit Number 10   Number of Visits 20   Date for PT Re-Evaluation 07/26/16   Authorization Type gcodes at 20th visit; KX at 15th visit   PT Start Time 1402   PT Stop Time 1446   PT Time Calculation (min) 44 min   Activity Tolerance Patient tolerated treatment well   Behavior During Therapy [facility] for tasks assessed/performed      Past Medical History:  Diagnosis Date  . CAD (coronary artery disease)   . Cancer [facility])    prostate  . COPD (chronic obstructive pulmonary disease) ([place])   . Diabetes mellitus without complication ([place])   . Gout   . Hyperlipidemia   . Myocardial infarction     Past Surgical History:  Procedure Laterality Date  . PROSTATE SURGERY     prostatectomy about 11 years ago    There were no vitals filed for this visit.      Subjective Assessment - 05/31/16 1410    Subjective The left leg and foot are giving me problems   Pertinent History prostate cancer, prostatectomy   Limitations Sitting;Standing;Walking   Patient Stated Goals stop leakage and be able to get back to the gym and have intercourse with my wife   Currently in Pain? Yes                         [facility] Adult PT Treatment/Exercise - 05/31/16 0001      Knee/Hip Exercises: Aerobic   Nustep L2 x 14 min  PT present to discuss progress     Manual Therapy   Manual therapy comments patient informed and consent given to perform soft tissue work internally    Internal Pelvic Floor levators, coccygeus, obdurator internis                  PT Short Term Goals - 05/31/16 1409      PT SHORT  TERM GOAL #1   Title independent with initial HEP   Time 4   Period Weeks   Status Achieved     PT SHORT TERM GOAL #2   Title able to decrease pad usage to 3 per day   Time 4   Period Weeks   Status On-going     PT SHORT TERM GOAL #3   Title able to have BM with 50% less difficulty due to ability to relax pelvic floor and demonstrate improved coordination of muscles   Time 4   Period Weeks   Status On-going     PT SHORT TERM GOAL #4   Title pain reduced by 50% due to decreased muscle spasms in pelvic floor   Time 4   Period Weeks   Status On-going           PT Long Term Goals - 05/31/16 1556      PT LONG TERM GOAL #1   Title FOTO limitation 43% or less   Time 16   Period Weeks   Status On-going     PT LONG TERM GOAL #2   Title independent with advanced HEP   Time 16  Period Weeks   Status On-going     PT LONG TERM GOAL #3   Title Able to identify the sensation of needing to use the bathroom   Time 16   Period Weeks   Status On-going     PT LONG TERM GOAL #4   Title able to reduce leakage to use only 2 pads per day.     Time 16   Period Weeks   Status On-going     PT LONG TERM GOAL #5   Title able to hold urine for 2 hours without assistive device   Time 16   Period Weeks   Status On-going               Plan - 2016/06/30 1407    Clinical Impression Statement Patient has made progress overall and feels like he has improved since start of treatment.  Pt has had increased tension since beginning exercises and muscle spasms in pelvic floor have increased pt continues to need skilled PT to work on lengthening muscle tightness in pelvic floor before being able to initiate strengthening program.  Pt is progresing slowly due to chronicity of problem, history of radiated tissues of pelvis, and prostatectomy.  Skilled PT will benefit patient for these reasons.   Rehab Potential Excellent   Clinical Impairments Affecting Rehab Potential prostate cancer,  prostatectomy, pelvic radiation   PT Treatment/Interventions ADLs/Self Care Home Management;Biofeedback;Cryotherapy;Electrical Stimulation;Moist Heat;Patient/family education;Therapeutic activities;Therapeutic exercise;Manual techniques;Neuromuscular re-education;Dry needling;Taping;Passive range of motion   PT Next Visit Plan f/u with leg length difference, STM to levators, coccygeus, obdurator internis   Consulted and Agree with Plan of Care Patient      Patient will benefit from skilled therapeutic intervention in order to improve the following deficits and impairments:  Decreased activity tolerance, Decreased endurance, Decreased strength, Difficulty walking, Pain, Postural dysfunction, Increased muscle spasms  Visit Diagnosis: Muscle weakness (generalized) - Plan: PT plan of care cert/re-cert  Other muscle spasm - Plan: PT plan of care cert/re-cert       G-Codes - 06/30/2016 1641    Functional Assessment Tool Used (Outpatient Only) FOTO   Functional Limitation Self care   Self Care Current Status (N4627) At least 60 percent but less than 80 percent impaired, limited or restricted   Self Care Goal Status (O3500) At least 40 percent but less than 60 percent impaired, limited or restricted   Functional Assessment Tool Used Clinical judgement      Problem List There are no active problems to display for this patient.   Zannie Cove, PT 2016/06/30, 5:13 PM  [facility] [address], [place], [place], [address] Phone: [phone]   Fax:  [phone]  Name: Silvester Reierson MRN: [id number] Date of Birth: [birth year]

## 2016-06-06 ENCOUNTER — Ambulatory Visit: Payer: Medicare Other | Admitting: Physical Therapy

## 2016-06-06 ENCOUNTER — Encounter: Payer: Self-pay | Admitting: Physical Therapy

## 2016-06-06 DIAGNOSIS — M62838 Other muscle spasm: Secondary | ICD-10-CM

## 2016-06-06 DIAGNOSIS — M6281 Muscle weakness (generalized): Secondary | ICD-10-CM | POA: Diagnosis not present

## 2016-06-06 NOTE — Therapy (Signed)
[facility] [address] [place], [place], [address] Phone: [phone]   Fax:  [phone]  Physical Therapy Treatment  Patient Details  Name: Micheal Fox MRN: [id number] Date of Birth: 07/07/1940 Referring Provider: Elita Quick, MD  Encounter Date: 06/06/2016      PT End of Session - 06/06/16 1232    Visit Number 11   Number of Visits 20   Date for PT Re-Evaluation 07/26/16   Authorization Type gcodes at 20th visit; KX at 15th visit   PT Start Time 1230   PT Stop Time 1317   PT Time Calculation (min) 47 min   Activity Tolerance Patient tolerated treatment well   Behavior During Therapy [facility] for tasks assessed/performed      Past Medical History:  Diagnosis Date  . CAD (coronary artery disease)   . Cancer [facility])    prostate  . COPD (chronic obstructive pulmonary disease) ([place])   . Diabetes mellitus without complication ([place])   . Gout   . Hyperlipidemia   . Myocardial infarction     Past Surgical History:  Procedure Laterality Date  . PROSTATE SURGERY     prostatectomy about 11 years ago    There were no vitals filed for this visit.      Subjective Assessment - 06/06/16 1314    Subjective My leg and foot are really bothering me today.    Pertinent History prostate cancer, prostatectomy   Limitations Sitting;Standing;Walking   Pain Score 8    Pain Orientation Left   Pain Descriptors / Indicators Aching   Pain Type Chronic pain   Pain Radiating Towards down to rectum, back of leg, leg and foot - leg and foot are the worst   Pain Onset More than a month ago   Aggravating Factors  sitting and walking   Pain Relieving Factors pain medicine, stretches   Effect of Pain on Daily Activities walking and activities, going out   Multiple Pain Sites No                         [facility] Adult PT Treatment/Exercise - 06/06/16 0001      Manual Therapy   Manual therapy comments patient  informed and consent given to perform soft tissue work internally    Soft tissue mobilization peroneals, hamstring, plantar foot, glutes (all left); demonstrate and performed self massage with foam roller   Internal Pelvic Floor levators, coccygeus, obdurator internis                  PT Short Term Goals - 06/06/16 1232      PT SHORT TERM GOAL #2   Title able to decrease pad usage to 3 per day   Baseline 3-4   Time 4   Period Weeks   Status On-going     PT SHORT TERM GOAL #4   Title pain reduced by 50% due to decreased muscle spasms in pelvic floor   Time 4   Period Weeks   Status On-going           PT Long Term Goals - 06/06/16 1233      PT LONG TERM GOAL #5   Title able to hold urine for 2 hours without assistive device   Baseline notice having a full bladder in the morning when I used to drip a lot at night  Plan - 06/06/16 1323    Clinical Impression Statement Continue soft tissue release, patient has good response with reduced pain after treatment.  Continues to be very tight and tender to all muscles of pelvic floor.  Pt understands how to use small foam roll for self massage.  Continues to need skilled PT for improved muscle length and core and pelvic floor strengthening   Clinical Impairments Affecting Rehab Potential prostate cancer, prostatectomy, pelvic radiation   PT Treatment/Interventions ADLs/Self Care Home Management;Biofeedback;Cryotherapy;Electrical Stimulation;Moist Heat;Patient/family education;Therapeutic activities;Therapeutic exercise;Manual techniques;Neuromuscular re-education;Dry needling;Taping;Passive range of motion   PT Next Visit Plan internal STM to pelvic floor, re-check pelvic obliquity and leg length, core strengthening   Consulted and Agree with Plan of Care Patient      Patient will benefit from skilled therapeutic intervention in order to improve the following deficits and impairments:  Decreased activity  tolerance, Decreased endurance, Decreased strength, Difficulty walking, Pain, Postural dysfunction, Increased muscle spasms  Visit Diagnosis: Muscle weakness (generalized)  Other muscle spasm     Problem List There are no active problems to display for this patient.   Zannie Cove, PT 06/06/2016, 1:28 PM  [facility] [address], [place], [place], [address] Phone: [phone]   Fax:  [phone]  Name: Micheal Fox MRN: [id number] Date of Birth: October 26, 1940

## 2016-06-07 ENCOUNTER — Encounter: Payer: Medicare Other | Admitting: Physical Therapy

## 2016-06-13 ENCOUNTER — Ambulatory Visit: Payer: Medicare Other | Admitting: Physical Therapy

## 2016-06-13 ENCOUNTER — Encounter: Payer: Self-pay | Admitting: Physical Therapy

## 2016-06-13 DIAGNOSIS — M6281 Muscle weakness (generalized): Secondary | ICD-10-CM

## 2016-06-13 DIAGNOSIS — M62838 Other muscle spasm: Secondary | ICD-10-CM

## 2016-06-13 NOTE — Patient Instructions (Signed)
Bowel Control and Holding On When bowel control is decreased or lost it is helpful to understand some control techniques. Here are some tips for controlling your bowel movements.  1. Choose the best time of day to have a bowel movement:  Usually the best time of day for a bowel movement will be a half hour to an hour after breakfast.  For some people, a half hour to an hour after lunch will work better.  These times are best because the body uses the gastrocolic reflex, a stimulation of bowel motion that occurs with eating, to help produce a bowel movement.  2. Make sure that you are not rushed and have convenient access to a bathroom at your selected time.  3. Eat all your meals at a predictable time each day.  The bowel functions best when food is introduced at the same regular intervals.  4. The amount of food eaten at a given time of day should be about the same size from day to day.  The bowel functions best when food is introduced in similar quantities from day to day.  It is fine to have a small breakfast and a large lunch, or vice versa, just be consistent.  5. Eat two servings of fruit or vegetables and at least one serving of complex carbohydrates (whole grains such as brown rice, bran, whole wheat bread, or oatmeal) at each meal.   A serving of fruit or vegetables is a half-cup or medium-sized piece of fruit.  A serving of a complex carbohydrate is a half-cup or a slice of bread.  It is often desirable to eat more than the recommended minimum amounts of fruits, vegetables, and complex carbohydrates.  6. Drink plenty of water-ideally eight glasses a day.  7. Until regular bowel movements are established at a desired time of day, take 2-3 dried prunes (or  to 1/3 cup of prune juice) each night to stimulate morning bowel function.  8. Exercise daily.  You may exercise at any time of day, but you may find that bowel function is helped most if the exercise is at a consistent time each  day.    Types of Fiber  There are two main types of fiber:  insoluble and soluble.  Both of these types can prevent and relieve constipation and diarrhea, although some people find one or the other to be more easily digested.  This handout details information about both types of fiber.  Insoluble Fiber       Functions of Insoluble Fiber . moves bulk through the intestines  . controls and balances the pH (acidity) in the intestines       Benefits of Insoluble Fiber . promotes regular bowel movement and prevents constipation  . removes fecal waste through colon in less time  . keeps an optimal pH in intestines to prevent microbes from producing cancer substances, therefore preventing colon cancer        Food Sources of Insoluble Fiber . whole-wheat products  . wheat bran "miller's bran" . corn bran  . flax seed or other seeds . vegetables such as green beans, broccoli, cauliflower and potato skins  . fruit skins and root vegetable skins  . popcorn . brown rice  ** **Soluble Fiber       Functions of Soluble Fiber  . holds water in the colon to bulk and soften the stool . prolongs stomach emptying time so that sugar is released and absorbed more slowly  Benefits of Soluble Fiber . lowers total cholesterol and LDL cholesterol (the bad cholesterol) therefore reducing the risk of heart disease  . regulates blood sugar for people with diabetes       Food Sources of Soluble Fiber . oat/oat bran . dried beans and peas  . nuts  . barley  . flax seed or other seeds . fruits such as oranges, pears, peaches, and apples  . vegetables such as carrots  . psyllium husk - start with small amount and increase gradually (drug or grocery store - dietary supplement section) . Prunes

## 2016-06-13 NOTE — Therapy (Signed)
[facility] [address] [place], [place], [address] Phone: [phone]   Fax:  [phone]  Physical Therapy Treatment  Patient Details  Name: Micheal Fox MRN: [id number] Date of Birth: 04/16/1940 Referring Provider: Elita Quick, MD  Encounter Date: 06/13/2016      PT End of Session - 06/13/16 1150    Visit Number 12   Number of Visits 20   Date for PT Re-Evaluation 07/26/16   Authorization Type gcodes at 20th visit; KX at 15th visit   PT Start Time 1148   PT Stop Time 1231   PT Time Calculation (min) 43 min   Activity Tolerance Patient tolerated treatment well   Behavior During Therapy [facility] for tasks assessed/performed      Past Medical History:  Diagnosis Date  . CAD (coronary artery disease)   . Cancer [facility])    prostate  . COPD (chronic obstructive pulmonary disease) ([place])   . Diabetes mellitus without complication ([place])   . Gout   . Hyperlipidemia   . Myocardial infarction [facility])     Past Surgical History:  Procedure Laterality Date  . PROSTATE SURGERY     prostatectomy about 11 years ago    There were no vitals filed for this visit.      Subjective Assessment - 06/13/16 1446    Subjective My pain is 50% better overall.   Pertinent History prostate cancer, prostatectomy   Limitations Sitting;Standing;Walking   Patient Stated Goals stop leakage and be able to get back to the gym and have intercourse with my wife   Currently in Pain? Yes   Pain Score 4    Pain Location Rectum   Pain Orientation Left   Pain Descriptors / Indicators Aching   Pain Type Chronic pain   Pain Radiating Towards down back of leg, left foot   Pain Onset More than a month ago   Aggravating Factors  sitting and walking   Effect of Pain on Daily Activities walking and exercises   Multiple Pain Sites No                         [facility] Adult PT Treatment/Exercise - 06/13/16 0001      Self-Care    Self-Care Other Self-Care Comments   Other Self-Care Comments  bowel function and fiber intake - educated on handouts     Neuro Re-ed    Neuro Re-ed Details  tactile feedback for posteriorpelvic floor contract/relax - performed 2 sec hold x10     Manual Therapy   Manual therapy comments patient informed and consent given to perform soft tissue work internally    Soft tissue mobilization peroneals, hamstring, plantar foot, glutes (all left); demonstrate and performed self massage with foam roller   Internal Pelvic Floor levators, coccygeus, obdurator internis                PT Education - 06/13/16 1448    Education provided Yes   Education Details bowel control and fiber   Person(s) Educated Patient   Methods Explanation;Handout   Comprehension Verbalized understanding          PT Short Term Goals - 06/13/16 1150      PT SHORT TERM GOAL #2   Title able to decrease pad usage to 3 per day   Baseline 3-4   Time 4   Period Weeks   Status On-going     PT SHORT TERM GOAL #3  Baseline leakage every other day     PT SHORT TERM GOAL #4   Title pain reduced by 50% due to decreased muscle spasms in pelvic floor   Baseline 50% decreased   Time 4   Period Weeks   Status Achieved           PT Long Term Goals - 06/13/16 1153      PT LONG TERM GOAL #1   Title FOTO limitation 43% or less   Time 16   Period Weeks   Status On-going     PT LONG TERM GOAL #3   Title Able to identify the sensation of needing to use the bathroom   Baseline have been able to have a full bladder in the morning   Time 16   Period Weeks   Status On-going               Plan - 06/13/16 1455    Clinical Impression Statement Pt overall doing 50% better.  Pt is able to perform contraction of pelvic floor muscles needing cues not o use glutes.  pt still demonstrates weakness of the pelvic floor and unable to maintain contraction more than 2 sec.  Pt will benefit from skilled PT to  begin strengthening gradually due in order to avoid increased muscle spasms.   Rehab Potential Excellent   Clinical Impairments Affecting Rehab Potential prostate cancer, prostatectomy, pelvic radiation   PT Treatment/Interventions ADLs/Self Care Home Management;Biofeedback;Cryotherapy;Electrical Stimulation;Moist Heat;Patient/family education;Therapeutic activities;Therapeutic exercise;Manual techniques;Neuromuscular re-education;Dry needling;Taping;Passive range of motion   PT Next Visit Plan STM to pelvic floor, feedback cues for contract and hold, pelvic floor   Consulted and Agree with Plan of Care Patient      Patient will benefit from skilled therapeutic intervention in order to improve the following deficits and impairments:  Decreased activity tolerance, Decreased endurance, Decreased strength, Difficulty walking, Pain, Postural dysfunction, Increased muscle spasms  Visit Diagnosis: Muscle weakness (generalized)  Other muscle spasm     Problem List There are no active problems to display for this patient.   Zannie Cove, PT 06/13/2016, 3:00 PM  [facility] [address], [place], [place], [address] Phone: [phone]   Fax:  [phone]  Name: Micheal Fox MRN: [id number] Date of Birth: 07-12-1940

## 2016-06-14 ENCOUNTER — Encounter: Payer: Medicare Other | Admitting: Physical Therapy

## 2016-06-20 ENCOUNTER — Encounter: Payer: Self-pay | Admitting: Physical Therapy

## 2016-06-20 ENCOUNTER — Ambulatory Visit: Payer: Medicare Other | Admitting: Physical Therapy

## 2016-06-20 DIAGNOSIS — M62838 Other muscle spasm: Secondary | ICD-10-CM

## 2016-06-20 DIAGNOSIS — M6281 Muscle weakness (generalized): Secondary | ICD-10-CM | POA: Diagnosis not present

## 2016-06-20 NOTE — Therapy (Signed)
[facility] [address] [place], [place], [address] Phone: [phone]   Fax:  [phone]  Physical Therapy Treatment  Patient Details  Name: Micheal Fox MRN: [id number] Date of Birth: [birth year] Referring Provider: Elita Quick, MD  Encounter Date: 06/20/2016      PT End of Session - 06/20/16 1701    Visit Number 13   Number of Visits 20   Date for PT Re-Evaluation 07/26/16   Authorization Type gcodes at 20th visit; KX at 15th visit   PT Start Time 1103   PT Stop Time 1145   PT Time Calculation (min) 42 min   Activity Tolerance Patient tolerated treatment well   Behavior During Therapy [facility] for tasks assessed/performed      Past Medical History:  Diagnosis Date  . CAD (coronary artery disease)   . Cancer [facility])    prostate  . COPD (chronic obstructive pulmonary disease) ([place])   . Diabetes mellitus without complication ([place])   . Gout   . Hyperlipidemia   . Myocardial infarction [facility])     Past Surgical History:  Procedure Laterality Date  . PROSTATE SURGERY     prostatectomy about 11 years ago    There were no vitals filed for this visit.      Subjective Assessment - 06/20/16 1659    Subjective I am feeling a lot better today and don't want to put a number on anything.  I haven't made any of the changes to my diet or bought the noodle because I couldn't find it.     Pertinent History prostate cancer, prostatectomy   Limitations Sitting;Standing;Walking   Patient Stated Goals stop leakage and be able to get back to the gym and have intercourse with my wife   Currently in Pain? No/denies                         [facility] Adult PT Treatment/Exercise - 06/20/16 0001      Knee/Hip Exercises: Supine   Other Supine Knee/Hip Exercises thoracic rotation open book stretch     Manual Therapy   Manual therapy comments patient informed and consent given to perform soft tissue work  internally    Soft tissue mobilization adductors, external perinuem of pelvic floor muscles in supine   Internal Pelvic Floor in side lying: levators, coccygeus, obdurator internis                PT Education - 06/20/16 1700    Education provided Yes   Education Details trunk rotation open book   Person(s) Educated Patient   Methods Explanation;Demonstration;Tactile cues;Verbal cues;Handout   Comprehension Verbalized understanding;Returned demonstration          PT Short Term Goals - 06/20/16 1703      PT SHORT TERM GOAL #2   Title able to decrease pad usage to 3 per day   Baseline 3-4   Time 4   Period Weeks   Status On-going     PT SHORT TERM GOAL #3   Title able to have BM with 50% less difficulty due to ability to relax pelvic floor and demonstrate improved coordination of muscles   Baseline no difficulty but still having leakage sometimes   Time 4   Period Weeks   Status Achieved           PT Long Term Goals - 06/13/16 1153      PT LONG TERM GOAL #1  Title FOTO limitation 43% or less   Time 16   Period Weeks   Status On-going     PT LONG TERM GOAL #3   Title Able to identify the sensation of needing to use the bathroom   Baseline have been able to have a full bladder in the morning   Time 16   Period Weeks   Status On-going               Plan - 06/20/16 1704    Clinical Impression Statement Pt continues to demonstrate improvement with increased STM during treatment.  He continues to have dereased sensation of perineum tissues and lack of muscle coordination for self care activities.  Pt will continue to benefit from skilled PT with manual techniques and progressing HEP for improved overall ROM, strength and functional mobility.   Rehab Potential Excellent   Clinical Impairments Affecting Rehab Potential prostate cancer, prostatectomy, pelvic radiation   PT Treatment/Interventions ADLs/Self Care Home  Management;Biofeedback;Cryotherapy;Electrical Stimulation;Moist Heat;Patient/family education;Therapeutic activities;Therapeutic exercise;Manual techniques;Neuromuscular re-education;Dry needling;Taping;Passive range of motion   PT Next Visit Plan STM to pelvic floor, feedback cues for contract and hold, pelvic floor and abdominal muscles   Consulted and Agree with Plan of Care Patient      Patient will benefit from skilled therapeutic intervention in order to improve the following deficits and impairments:  Decreased activity tolerance, Decreased endurance, Decreased strength, Difficulty walking, Pain, Postural dysfunction, Increased muscle spasms  Visit Diagnosis: Muscle weakness (generalized)  Other muscle spasm     Problem List There are no active problems to display for this patient.   Zannie Cove, PT 06/20/2016, 5:08 PM  [facility] [address] [place], [place], [address] Phone: [phone]   Fax:  [phone]  Name: Micheal Fox MRN: [id number] Date of Birth: 09/30/1940

## 2016-06-20 NOTE — Patient Instructions (Signed)
Rotation: Lumbar Cranial - Side-Lying    Lie on right side, hips and knees bent 90, neck supported. Lower top arm behind, breathing out. Hold for __3__ breaths. Repeat __10__ times per set. Do __1__ sets per session.   Copyright  VHI. All rights reserved.

## 2016-06-21 ENCOUNTER — Encounter: Payer: Medicare Other | Admitting: Physical Therapy

## 2016-06-27 ENCOUNTER — Ambulatory Visit: Payer: Medicare Other | Attending: Gastroenterology | Admitting: Physical Therapy

## 2016-06-27 DIAGNOSIS — M6281 Muscle weakness (generalized): Secondary | ICD-10-CM | POA: Diagnosis not present

## 2016-06-27 DIAGNOSIS — M62838 Other muscle spasm: Secondary | ICD-10-CM | POA: Diagnosis present

## 2016-06-27 NOTE — Therapy (Signed)
[facility] [address] [place], [place], [address] Phone: [phone]   Fax:  [phone]  Physical Therapy Treatment  Patient Details  Name: Micheal Fox MRN: [id number] Date of Birth: 1940-05-26 Referring Provider: Elita Quick, MD  Encounter Date: 06/27/2016      PT End of Session - 06/27/16 1255    Visit Number 14   Number of Visits 20   Date for PT Re-Evaluation 07/26/16   Authorization Type gcodes at 20th visit; KX at 15th visit   PT Start Time 1147   PT Stop Time 1231   PT Time Calculation (min) 44 min   Activity Tolerance Patient tolerated treatment well   Behavior During Therapy [facility] for tasks assessed/performed      Past Medical History:  Diagnosis Date  . CAD (coronary artery disease)   . Cancer [facility])    prostate  . COPD (chronic obstructive pulmonary disease) ([place])   . Diabetes mellitus without complication ([place])   . Gout   . Hyperlipidemia   . Myocardial infarction [facility])     Past Surgical History:  Procedure Laterality Date  . PROSTATE SURGERY     prostatectomy about 11 years ago    There were no vitals filed for this visit.      Subjective Assessment - 06/27/16 1253    Subjective Patient not able to give number but is feeling minimal pain today.  He walks about one minute every hour and    Pertinent History prostate cancer, prostatectomy   Limitations Sitting;Standing;Walking   Patient Stated Goals stop leakage and be able to get back to the gym and have intercourse with my wife   Currently in Pain? No/denies                         [facility] Adult PT Treatment/Exercise - 06/27/16 0001      Neuro Re-ed    Neuro Re-ed Details  158.62 mV at rest, 411.21mV with one quick contract, 468 mV with 10 sec, 351mV with 20sec hold, 480 mV post exercise tone; tactile feedback and cues for correct breathing with bulging pelvic floor activitiessitting on ball, ball squeezes      Knee/Hip Exercises: Seated   Ball Squeeze sitting and supine with breathing and PF contract   Marching Limitations marching with PF contract whilesitting on ball                  PT Short Term Goals - 06/20/16 1703      PT SHORT TERM GOAL #2   Title able to decrease pad usage to 3 per day   Baseline 3-4   Time 4   Period Weeks   Status On-going     PT SHORT TERM GOAL #3   Title able to have BM with 50% less difficulty due to ability to relax pelvic floor and demonstrate improved coordination of muscles   Baseline no difficulty but still having leakage sometimes   Time 4   Period Weeks   Status Achieved           PT Long Term Goals - 06/27/16 1255      PT LONG TERM GOAL #2   Title independent with advanced HEP   Time 16   Period Weeks   Status On-going     PT LONG TERM GOAL #3   Title Able to identify the sensation of needing to use the bathroom   Time 16  Period Weeks   Status On-going     PT LONG TERM GOAL #4   Title able to reduce leakage to use only 2 pads per day.     Time 16   Period Weeks   Status On-going     PT LONG TERM GOAL #5   Title able to hold urine for 2 hours without assistive device   Time 16   Period Weeks   Status On-going               Plan - 06/27/16 1256    Clinical Impression Statement Biofeedback performed today.  Pt has elevated tone and difficulty relaxing after contractions.  Pt demonstrates improved control with tactile cues.  able to perform breathing better during session but continues to need a lot of cues.  Educated on breathing technique and reinformced importance of practicing at home.  Pt needs skilled PT for improved body awareness for functional activities an dself care   Rehab Potential Excellent   PT Treatment/Interventions ADLs/Self Care Home Management;Biofeedback;Cryotherapy;Electrical Stimulation;Moist Heat;Patient/family education;Therapeutic activities;Therapeutic exercise;Manual  techniques;Neuromuscular re-education;Dry needling;Taping;Passive range of motion   Consulted and Agree with Plan of Care Patient      Patient will benefit from skilled therapeutic intervention in order to improve the following deficits and impairments:  Decreased activity tolerance, Decreased endurance, Decreased strength, Difficulty walking, Pain, Postural dysfunction, Increased muscle spasms  Visit Diagnosis: Muscle weakness (generalized)  Other muscle spasm     Problem List There are no active problems to display for this patient.   Zannie Cove, PT 06/27/2016, 1:02 PM  [facility] [address] [place], [place], [address] Phone: [phone]   Fax:  [phone]  Name: Micheal Fox MRN: [id number] Date of Birth: 1940-05-02

## 2016-07-04 ENCOUNTER — Ambulatory Visit: Payer: Medicare Other | Admitting: Physical Therapy

## 2016-07-04 ENCOUNTER — Encounter: Payer: Self-pay | Admitting: Physical Therapy

## 2016-07-04 DIAGNOSIS — M6281 Muscle weakness (generalized): Secondary | ICD-10-CM

## 2016-07-04 DIAGNOSIS — M62838 Other muscle spasm: Secondary | ICD-10-CM

## 2016-07-04 NOTE — Patient Instructions (Addendum)
   Bracing With Heel Slides (Supine)    With neutral spine, tighten pelvic floor and abdominals and hold. Alternating legs, slide heel to bottom. Repeat __20_ times. Do ___ times a day.   Copyright  VHI. All rights reserved.     STRAIGHT LEG RAISE - SLR  While lying on your back, raise up your leg with a straight knee.  Keep the opposite knee bent with the foot planted on the ground. Repeat 20x   Leg out to the side and back 20x  ELASTIC BAND - SUPINE HIP ABDUCTION  While lying on your back, slowly bring your leg out to the side. Keep  your knee straight the entire time.    Band around knees and pull knees apart - 20x    Move foot in circles without moving the leg - 10x each direction

## 2016-07-04 NOTE — Therapy (Signed)
[facility] [address], [place], [place], [address] Phone: [phone]   Fax:  [phone]  Physical Therapy Treatment  Patient Details  Name: Micheal Fox MRN: [id number] Date of Birth: 02/27/1941 Referring Provider: Elita Quick, MD  Encounter Date: 07/04/2016      PT End of Session - 07/04/16 1144    Visit Number 15   Number of Visits 20   Date for PT Re-Evaluation 07/26/16   Authorization Type gcodes at 20th visit; KX at 15th visit   PT Start Time 1144   PT Stop Time 1232   PT Time Calculation (min) 48 min   Activity Tolerance Patient tolerated treatment well   Behavior During Therapy [facility] for tasks assessed/performed      Past Medical History:  Diagnosis Date  . CAD (coronary artery disease)   . Cancer [facility])    prostate  . COPD (chronic obstructive pulmonary disease) ([place])   . Diabetes mellitus without complication ([place])   . Gout   . Hyperlipidemia   . Myocardial infarction [facility])     Past Surgical History:  Procedure Laterality Date  . PROSTATE SURGERY     prostatectomy about 11 years ago    There were no vitals filed for this visit.      Subjective Assessment - 07/04/16 1226    Subjective Patient has been having more foot drop   Pertinent History prostate cancer, prostatectomy   Limitations Sitting;Standing;Walking   Patient Stated Goals stop leakage and be able to get back to the gym and have intercourse with my wife   Currently in Pain? Yes   Pain Score 5    Pain Location Rectum   Pain Orientation Left   Pain Descriptors / Indicators Aching   Pain Type Chronic pain   Pain Onset More than a month ago   Pain Frequency Intermittent   Aggravating Factors  sitting walking   Pain Relieving Factors pain medicine manages it   Effect of Pain on Daily Activities walking    Multiple Pain Sites No                         [facility] Adult PT Treatment/Exercise - 07/04/16 0001       Knee/Hip Exercises: Seated   Knee/Hip Flexion hamstring curl left LE- red band - 20x     Knee/Hip Exercises: Supine   Straight Leg Raises Strengthening;15 reps;Left   Knee Flexion Strengthening;15 reps;Left  heel slides   Other Supine Knee/Hip Exercises ankle 4 ways yellow band - DF 20x, PF/INV/EV - 10x each   Other Supine Knee/Hip Exercises hip abduction/adduction slides     Manual Therapy   Manual Therapy Soft tissue mobilization   Soft tissue mobilization peroneals and vastus lateralis on left leg                PT Education - 07/04/16 1333    Education provided Yes   Education Details ankle circles, hip in supine, ankle brace   Person(s) Educated Patient   Methods Explanation;Demonstration;Tactile cues;Verbal cues;Handout   Comprehension Verbalized understanding;Returned demonstration          PT Short Term Goals - 06/20/16 1703      PT SHORT TERM GOAL #2   Title able to decrease pad usage to 3 per day   Baseline 3-4   Time 4   Period Weeks   Status On-going     PT SHORT TERM GOAL #3  Title able to have BM with 50% less difficulty due to ability to relax pelvic floor and demonstrate improved coordination of muscles   Baseline no difficulty but still having leakage sometimes   Time 4   Period Weeks   Status Achieved           PT Long Term Goals - 07/04/16 1504      PT LONG TERM GOAL #1   Title FOTO limitation 43% or less   Time 16   Period Weeks   Status On-going     PT LONG TERM GOAL #2   Title independent with advanced HEP   Time 16   Period Weeks   Status On-going     PT LONG TERM GOAL #3   Title Able to identify the sensation of needing to use the bathroom   Time 16   Period Weeks   Status On-going     PT LONG TERM GOAL #4   Title able to reduce leakage to use only 2 pads per day.     Time 16   Period Weeks   Status On-going     PT LONG TERM GOAL #5   Title able to hold urine for 2 hours without assistive device   Time  16   Period Weeks   Status On-going               Plan - 07/04/16 1144    Clinical Impression Statement Pt presents to clinic with some foot drop of left LE upon standing up.  He is able to catch himself when he has it and then pick his leg up higher, but he appears to be getting weaker. Pt was educated on making a f/u visit with doctor for imaging..  Pt was educated on HEP and will need skilled PT for follow up on HEP   Rehab Potential Excellent   Clinical Impairments Affecting Rehab Potential prostate cancer, prostatectomy, pelvic radiation   PT Treatment/Interventions ADLs/Self Care Home Management;Biofeedback;Cryotherapy;Electrical Stimulation;Moist Heat;Patient/family education;Therapeutic activities;Therapeutic exercise;Manual techniques;Neuromuscular re-education;Dry needling;Taping;Passive range of motion   PT Next Visit Plan LE and pelvic floor strenthening as tolerated, manual as needed, F/U on getting doctor appointment   Recommended Other Services MRI   Consulted and Agree with Plan of Care Patient      Patient will benefit from skilled therapeutic intervention in order to improve the following deficits and impairments:  Decreased activity tolerance, Decreased endurance, Decreased strength, Difficulty walking, Pain, Postural dysfunction, Increased muscle spasms  Visit Diagnosis: Other muscle spasm  Muscle weakness (generalized)     Problem List There are no active problems to display for this patient.   Zannie Cove, PT 07/04/2016, 3:05 PM  [facility] [address] [place], [place], [address] Phone: [phone]   Fax:  [phone]  Name: Micheal Fox MRN: [id number] Date of Birth: 27-Jun-1940

## 2016-07-11 ENCOUNTER — Ambulatory Visit: Payer: Medicare Other | Admitting: Physical Therapy

## 2016-07-11 DIAGNOSIS — M6281 Muscle weakness (generalized): Secondary | ICD-10-CM

## 2016-07-11 DIAGNOSIS — M62838 Other muscle spasm: Secondary | ICD-10-CM

## 2016-07-11 NOTE — Therapy (Signed)
[facility] [address], [place], [place], [address] Phone: [phone]   Fax:  [phone]  Physical Therapy Treatment  Patient Details  Name: Micheal Fox MRN: [id number] Date of Birth: 03-29-1940 Referring Provider: Elita Quick, MD  Encounter Date: 07/11/2016      PT End of Session - 07/11/16 1533    Visit Number 16   Number of Visits 20   Date for PT Re-Evaluation 07/26/16   Authorization Type gcodes at 20th visit; KX at 15th visit   PT Start Time 1145   PT Stop Time 1233   PT Time Calculation (min) 48 min   Activity Tolerance Patient tolerated treatment well   Behavior During Therapy [facility] for tasks assessed/performed      Past Medical History:  Diagnosis Date  . CAD (coronary artery disease)   . Cancer [facility])    prostate  . COPD (chronic obstructive pulmonary disease) ([place])   . Diabetes mellitus without complication ([place])   . Gout   . Hyperlipidemia   . Myocardial infarction [facility])     Past Surgical History:  Procedure Laterality Date  . PROSTATE SURGERY     prostatectomy about 11 years ago    There were no vitals filed for this visit.      Subjective Assessment - 07/11/16 1315    Subjective States he has an MRI scheduled for today.  Pt continues to have difficulty with foot drop and is mostly just trying to walk for exercise   Pertinent History prostate cancer, prostatectomy   Limitations Sitting;Standing;Walking   Currently in Pain? Yes   Pain Score 5    Pain Location Rectum   Pain Orientation Left   Pain Descriptors / Indicators Aching   Pain Type Chronic pain   Pain Radiating Towards down left leg to the heel   Pain Onset More than a month ago   Pain Frequency Intermittent   Aggravating Factors  sitting and walking   Pain Relieving Factors pain medicine   Effect of Pain on Daily Activities walking and exercise   Multiple Pain Sites No                          [facility] Adult PT Treatment/Exercise - 07/11/16 0001      Knee/Hip Exercises: Seated   Long Arc Quad Strengthening;Right;Left;20 reps   Knee/Hip Flexion hamstring curl left LE- red band - 20x   Marching Limitations 20x   Hamstring Curl Strengthening;20 reps  red band   Abduction/Adduction  Strengthening;Both;20 reps  red band   Sit to Sand 15 reps  yellow band for increased hip abduction     Manual Therapy   Manual Therapy Soft tissue mobilization;Myofascial release   Soft tissue mobilization left glutes and lumbar paraspinals   Myofascial Release abdominal and diaphragm, scar tissue below naval                  PT Short Term Goals - 06/20/16 1703      PT SHORT TERM GOAL #2   Title able to decrease pad usage to 3 per day   Baseline 3-4   Time 4   Period Weeks   Status On-going     PT SHORT TERM GOAL #3   Title able to have BM with 50% less difficulty due to ability to relax pelvic floor and demonstrate improved coordination of muscles   Baseline no difficulty but still having leakage sometimes  Time 4   Period Weeks   Status Achieved           PT Long Term Goals - 07/11/16 1538      PT LONG TERM GOAL #1   Title FOTO limitation 43% or less   Time 16   Period Weeks   Status On-going     PT LONG TERM GOAL #2   Title independent with advanced HEP   Time 16   Period Weeks   Status On-going     PT LONG TERM GOAL #3   Title Able to identify the sensation of needing to use the bathroom   Time 16   Period Weeks   Status On-going     PT LONG TERM GOAL #4   Title able to reduce leakage to use only 2 pads per day.     Time 16   Period Weeks   Status On-going               Plan - 07/11/16 1534    Clinical Impression Statement Pt continues to have foot drop.  He was very fatigued after exercises today with more instability of gait after performing exercises.  Pt continues to have weakness and pain in left LE . He will go to receive MRI after  today's treatment.  Skilled PT needed for strengthening, pain reduction, and improved muscle coordination for self care activities.   Clinical Impairments Affecting Rehab Potential prostate cancer, prostatectomy, pelvic radiation   PT Treatment/Interventions ADLs/Self Care Home Management;Biofeedback;Cryotherapy;Electrical Stimulation;Moist Heat;Patient/family education;Therapeutic activities;Therapeutic exercise;Manual techniques;Neuromuscular re-education;Dry needling;Taping;Passive range of motion   PT Next Visit Plan LE and pelvic floor strenthening as tolerated, manual as needed   Consulted and Agree with Plan of Care Patient      Patient will benefit from skilled therapeutic intervention in order to improve the following deficits and impairments:  Decreased activity tolerance, Decreased endurance, Decreased strength, Difficulty walking, Pain, Postural dysfunction, Increased muscle spasms  Visit Diagnosis: Other muscle spasm  Muscle weakness (generalized)     Problem List There are no active problems to display for this patient.   Zannie Cove, PT 07/11/2016, 3:48 PM  [facility] [address] [place], [place], [address] Phone: [phone]   Fax:  [phone]  Name: Bakari Nikolai MRN: [id number] Date of Birth: 04-11-1940

## 2016-07-18 ENCOUNTER — Ambulatory Visit: Payer: Medicare Other | Admitting: Physical Therapy

## 2016-07-18 DIAGNOSIS — M6281 Muscle weakness (generalized): Secondary | ICD-10-CM | POA: Diagnosis not present

## 2016-07-18 DIAGNOSIS — M62838 Other muscle spasm: Secondary | ICD-10-CM

## 2016-07-18 NOTE — Patient Instructions (Signed)

## 2016-07-18 NOTE — Therapy (Signed)
[facility] [address], [place], [place], [address] Phone: [phone]   Fax:  [phone]  Physical Therapy Treatment  Patient Details  Name: Micheal Fox MRN: [id number] Date of Birth: [birth year] Referring Provider: Elita Quick, MD  Encounter Date: 07/18/2016      PT End of Session - 07/18/16 1154    Visit Number 1   Number of Visits 20   Date for PT Re-Evaluation 07/26/16   Authorization Type gcodes at 20th visit; KX at 15th visit   PT Start Time 1150   PT Stop Time 1230   PT Time Calculation (min) 40 min   Activity Tolerance Patient tolerated treatment well   Behavior During Therapy [facility] for tasks assessed/performed      Past Medical History:  Diagnosis Date  . CAD (coronary artery disease)   . Cancer [facility])    prostate  . COPD (chronic obstructive pulmonary disease) ([place])   . Diabetes mellitus without complication ([place])   . Gout   . Hyperlipidemia   . Myocardial infarction [facility])     Past Surgical History:  Procedure Laterality Date  . PROSTATE SURGERY     prostatectomy about 11 years ago    There were no vitals filed for this visit.      Subjective Assessment - 07/18/16 1156    Subjective I take aleve and that is what helps the most. I go to get results form MRI today.     Pertinent History prostate cancer, prostatectomy   Limitations Sitting;Standing;Walking   Patient Stated Goals stop leakage and be able to get back to the gym and have intercourse with my wife   Currently in Pain? No/denies                         [facility] Adult PT Treatment/Exercise - 07/18/16 0001      Manual Therapy   Manual Therapy Soft tissue mobilization   Soft tissue mobilization left glutes and lumbar paraspinal          Trigger Point Dry Needling - 07/18/16 1209    Consent Given? Yes   Education Handout Provided Yes   Muscles Treated Upper Body --  bilateral lumbar multifidy; bilat  lumbar paraspinals - twitc   Muscles Treated Lower Body Gluteus minimus;Gluteus maximus;Piriformis  performed by Sigurd Sos, PT   Gluteus Maximus Response Twitch response elicited;Palpable increased muscle length   Gluteus Minimus Response Twitch response elicited;Palpable increased muscle length   Piriformis Response Twitch response elicited;Palpable increased muscle length              PT Education - 07/18/16 1355    Education provided Yes   Education Details dry needling information   Person(s) Educated Patient   Methods Explanation;Handout   Comprehension Verbalized understanding          PT Short Term Goals - 06/20/16 1703      PT SHORT TERM GOAL #2   Title able to decrease pad usage to 3 per day   Baseline 3-4   Time 4   Period Weeks   Status On-going     PT SHORT TERM GOAL #3   Title able to have BM with 50% less difficulty due to ability to relax pelvic floor and demonstrate improved coordination of muscles   Baseline no difficulty but still having leakage sometimes   Time 4   Period Weeks   Status Achieved  PT Long Term Goals - 07/18/16 1401      PT LONG TERM GOAL #1   Title FOTO limitation 43% or less   Time 16   Period Weeks   Status On-going     PT LONG TERM GOAL #2   Title independent with advanced HEP   Time 16   Period Weeks   Status On-going     PT LONG TERM GOAL #3   Title Able to identify the sensation of needing to use the bathroom   Time 16   Period Weeks   Status On-going     PT LONG TERM GOAL #4   Title able to reduce leakage to use only 2 pads per day.     Time 16   Period Weeks   Status On-going     PT LONG TERM GOAL #5   Title able to hold urine for 2 hours without assistive device   Time 16   Period Weeks   Status On-going               Plan - 07/18/16 1150    Clinical Impression Statement Patient responded well to dry needling performed by Sigurd Sos, PT.  Pt's gait prior to treatment was  very unsteady with increased giving way and scissoring, but after treatment his gait was more steady without any scissoring.  Pt is not open to using any AD.  He will benefit from skilled PT due in order to transition to HEP for improved gait and self care activities.   Rehab Potential Excellent   Clinical Impairments Affecting Rehab Potential prostate cancer, prostatectomy, pelvic radiation   PT Treatment/Interventions ADLs/Self Care Home Management;Biofeedback;Cryotherapy;Electrical Stimulation;Moist Heat;Patient/family education;Therapeutic activities;Therapeutic exercise;Manual techniques;Neuromuscular re-education;Dry needling;Taping;Passive range of motion   PT Next Visit Plan LE and pelvic floor strenthening as tolerated, manual as needed; HEP reviewed   Consulted and Agree with Plan of Care Patient      Patient will benefit from skilled therapeutic intervention in order to improve the following deficits and impairments:  Decreased activity tolerance, Decreased endurance, Decreased strength, Difficulty walking, Pain, Postural dysfunction, Increased muscle spasms  Visit Diagnosis: Other muscle spasm  Muscle weakness (generalized)     Problem List There are no active problems to display for this patient.   Zannie Cove, PT 07/18/2016, 2:14 PM  [facility] [address] [place], [place], [address] Phone: [phone]   Fax:  [phone]  Name: Micheal Fox MRN: [id number] Date of Birth: 17-Sep-1940

## 2016-07-25 ENCOUNTER — Ambulatory Visit: Payer: Medicare Other | Admitting: Physical Therapy

## 2016-07-25 DIAGNOSIS — M6281 Muscle weakness (generalized): Secondary | ICD-10-CM

## 2016-07-25 DIAGNOSIS — M62838 Other muscle spasm: Secondary | ICD-10-CM

## 2016-07-25 NOTE — Therapy (Signed)
[facility] [address], [place], [place], [address] Phone: [phone]   Fax:  [phone]  Physical Therapy Treatment  Patient Details  Name: Micheal Fox MRN: [id number] Date of Birth: [birth year] Referring Provider: Elita Quick, MD  Encounter Date: 07/25/2016      PT End of Session - 07/25/16 1204    Visit Number 18   Number of Visits 28   Date for PT Re-Evaluation 09/19/16   Authorization Type gcodes at 20th visit; KX at 15th visit   PT Start Time 1145   PT Stop Time 1231   PT Time Calculation (min) 46 min   Activity Tolerance Patient tolerated treatment well   Behavior During Therapy [facility] for tasks assessed/performed      Past Medical History:  Diagnosis Date  . CAD (coronary artery disease)   . Cancer [facility])    prostate  . COPD (chronic obstructive pulmonary disease) ([place])   . Diabetes mellitus without complication ([place])   . Gout   . Hyperlipidemia   . Myocardial infarction [facility])     Past Surgical History:  Procedure Laterality Date  . PROSTATE SURGERY     prostatectomy about 11 years ago    There were no vitals filed for this visit.      Subjective Assessment - 07/25/16 1159    Subjective I don't really have pain but I took pain medicine before coming.  I realized that I am not constipated and stopped taking the medicine for constipation.   Pertinent History prostate cancer, prostatectomy   Patient Stated Goals stop leakage and be able to get back to the gym and have intercourse with my wife   Currently in Pain? No/denies                         [facility] Adult PT Treatment/Exercise - 07/25/16 0001      Knee/Hip Exercises: Supine   Other Supine Knee/Hip Exercises SKTC and piriformis stretch - 3x20 sec   Other Supine Knee/Hip Exercises internal and external hip rotation stretch     Knee/Hip Exercises: Sidelying   Clams 15x with PT stabilizing pelvis  improved ROM      Manual Therapy   Manual Therapy Soft tissue mobilization;Joint mobilization   Joint Mobilization long axix distraction left hip   Soft tissue mobilization left glutes and lumbar paraspinal                  PT Short Term Goals - 07/25/16 1255      PT SHORT TERM GOAL #2   Title able to decrease pad usage to 3 per day   Time 4   Period Weeks   Status On-going           PT Long Term Goals - 07/25/16 1256      PT LONG TERM GOAL #1   Title FOTO limitation 43% or less   Time 16   Period Weeks   Status On-going     PT LONG TERM GOAL #2   Title independent with advanced HEP   Time 16   Period Weeks   Status On-going     PT LONG TERM GOAL #3   Title Able to identify the sensation of needing to use the bathroom   Baseline have been able to have a full bladder in the morning   Time 16   Period Weeks   Status On-going     PT LONG  TERM GOAL #4   Title able to reduce leakage to use only 2 pads per day.     Time 16   Period Weeks   Status On-going     PT LONG TERM GOAL #5   Title able to hold urine for 2 hours without assistive device   Baseline notice having a full bladder in the morning when I used to drip a lot at night   Time 16   Period Weeks   Status On-going               Plan - 2016-08-06 1147    Clinical Impression Statement Patient demonstrates some improved glute med activation with clamshells today.  He continues to walk with antalgic gait  and Trendelenburg on Left.  Pt has severe weakness of left LE.  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 He has made progress with reduced pain.  He had good results from dry needling at previous session.  Pt has some discoloration that looks like bruising on the top of left toes and left foot feels slightly cooler to touch.  PT is asking to recertify patient orders due to his condition chronicity and his good response from dry needling.  He will benefit from more  treatments to ensure all treatment possibilities have been attempted.  Without further PT he is expected to regress at this time.  He will continue to benefit from skilled PT to work on deficits in order to walk with less risk of falls   Rehab Potential Excellent   Clinical Impairments Affecting Rehab Potential prostate cancer, prostatectomy, pelvic radiation   PT Treatment/Interventions ADLs/Self Care Home Management;Biofeedback;Cryotherapy;Electrical Stimulation;Moist Heat;Patient/family education;Therapeutic activities;Therapeutic exercise;Manual techniques;Neuromuscular re-education;Dry needling;Taping;Passive range of motion   PT Next Visit Plan LE and pelvic floor strenthening as tolerated, dry needling to left lumbar and glutes   Consulted and Agree with Plan of Care Patient      Patient will benefit from skilled therapeutic intervention in order to improve the following deficits and impairments:  Decreased activity tolerance, Decreased endurance, Decreased strength, Difficulty walking, Pain, Postural dysfunction, Increased muscle spasms  Visit Diagnosis: Other muscle spasm - Plan: PT plan of care cert/re-cert  Muscle weakness (generalized) - Plan: PT plan of care cert/re-cert       G-Codes - 2016-08-06 1305    Functional Assessment Tool Used (Outpatient Only) FOTO   Functional Limitation Self care   Self Care Current Status (L8937) At least 60 percent but less than 80 percent impaired, limited or restricted   Self Care Goal Status (D4287) At least 40 percent but less than 60 percent impaired, limited or restricted   Functional Assessment Tool Used Clinical judgement      Problem List There are no active problems to display for this patient.   Zannie Cove, PT 08/06/16, 1:10 PM  [facility] [address], [place], [place], [address] Phone: [phone]   Fax:  [phone]  Name: Micheal Fox MRN:  [id number] Date of Birth: 02-20-1941

## 2016-08-02 ENCOUNTER — Ambulatory Visit: Payer: Medicare Other | Attending: Gastroenterology | Admitting: Physical Therapy

## 2016-08-02 DIAGNOSIS — M6281 Muscle weakness (generalized): Secondary | ICD-10-CM | POA: Insufficient documentation

## 2016-08-02 DIAGNOSIS — M62838 Other muscle spasm: Secondary | ICD-10-CM

## 2016-08-02 NOTE — Therapy (Signed)
[facility] [address], [place], [place], [address] Phone: [phone]   Fax:  [phone]  Physical Therapy Treatment  Patient Details  Name: Micheal Fox MRN: [id number] Date of Birth: 04-01-1940 Referring Provider: Elita Quick, MD  Encounter Date: 08/02/2016      PT End of Session - 08/02/16 1514    Visit Number 19   Number of Visits 28   Date for PT Re-Evaluation 09/19/16   Authorization Type gcodes at 28th visit; KX at 15th visit   PT Start Time 1146  dry needling   PT Stop Time 1241   PT Time Calculation (min) 55 min   Activity Tolerance Patient tolerated treatment well   Behavior During Therapy [facility] for tasks assessed/performed      Past Medical History:  Diagnosis Date  . CAD (coronary artery disease)   . Cancer [facility])    prostate  . COPD (chronic obstructive pulmonary disease) ([place])   . Diabetes mellitus without complication ([place])   . Gout   . Hyperlipidemia   . Myocardial infarction [facility])     Past Surgical History:  Procedure Laterality Date  . PROSTATE SURGERY     prostatectomy about 11 years ago    There were no vitals filed for this visit.      Subjective Assessment - 08/02/16 1508    Subjective Reports his leg has been dropping more and trips on his left foot when walking to the treatment room today.   Pertinent History prostate cancer, prostatectomy   Limitations Sitting;Standing;Walking   Patient Stated Goals stop leakage and be able to get back to the gym and have intercourse with my wife   Currently in Pain? Yes   Pain Score 4    Pain Location Back   Pain Orientation Left   Pain Descriptors / Indicators Aching   Pain Type Chronic pain   Pain Radiating Towards down the left leg to heel   Pain Onset More than a month ago   Pain Frequency Intermittent   Aggravating Factors  sitting and walking   Pain Relieving Factors pain medicine   Effect of Pain on Daily Activities  walking   Multiple Pain Sites No                         [facility] Adult PT Treatment/Exercise - 08/02/16 0001      Modalities   Modalities Electrical Stimulation;Moist Heat     Moist Heat Therapy   Number Minutes Moist Heat 15 Minutes   Moist Heat Location Lumbar Spine     Electrical Stimulation   Electrical Stimulation Location left lumbar   Electrical Stimulation Action IFC   Electrical Stimulation Parameters to tolerance x 15 min   Electrical Stimulation Goals Pain     Manual Therapy   Manual Therapy Soft tissue mobilization   Soft tissue mobilization left glutes, lumbar erectors and QL          Trigger Point Dry Needling - 08/02/16 1510    Consent Given? Yes   Muscles Treated Upper Body --  lumbar multifidi and erectors - twitch response muscle lengt   Muscles Treated Lower Body Gluteus minimus;Gluteus maximus   Gluteus Maximus Response Twitch response elicited;Palpable increased muscle length   Gluteus Minimus Response Twitch response elicited;Palpable increased muscle length                PT Short Term Goals - 07/25/16 1255  PT SHORT TERM GOAL #2   Title able to decrease pad usage to 3 per day   Time 4   Period Weeks   Status On-going           PT Long Term Goals - 08/02/16 1521      PT LONG TERM GOAL #1   Title FOTO limitation 43% or less   Time 8   Period Weeks   Status On-going     PT LONG TERM GOAL #2   Title independent with advanced HEP   Time 8   Period Weeks   Status On-going               Plan - 08/02/16 1514    Clinical Impression Statement Patient responded well to dry needling with increased tissue length.  Pt continues to have LE weakness with difficulty walking.  He will benefit from skilled PT to facilitate improved muscle activation and improve strength.   Clinical Impairments Affecting Rehab Potential prostate cancer, prostatectomy, pelvic radiation   PT Treatment/Interventions ADLs/Self Care  Home Management;Biofeedback;Cryotherapy;Electrical Stimulation;Moist Heat;Patient/family education;Therapeutic activities;Therapeutic exercise;Manual techniques;Neuromuscular re-education;Dry needling;Taping;Passive range of motion   PT Next Visit Plan assess response to dry needle #2, manual and estim as needed strengthening as tolerated   Consulted and Agree with Plan of Care Patient      Patient will benefit from skilled therapeutic intervention in order to improve the following deficits and impairments:  Decreased activity tolerance, Decreased endurance, Decreased strength, Difficulty walking, Pain, Postural dysfunction, Increased muscle spasms  Visit Diagnosis: Other muscle spasm  Muscle weakness (generalized)     Problem List There are no active problems to display for this patient.   Zannie Cove, PT 08/02/2016, 3:24 PM  [facility] [address], [place], [place], [address] Phone: [phone]   Fax:  [phone]  Name: Micheal Fox MRN: [id number] Date of Birth: 01/13/1941

## 2016-08-08 ENCOUNTER — Encounter: Payer: Self-pay | Admitting: Physical Therapy

## 2016-08-08 ENCOUNTER — Ambulatory Visit: Payer: Medicare Other | Admitting: Physical Therapy

## 2016-08-08 DIAGNOSIS — M62838 Other muscle spasm: Secondary | ICD-10-CM

## 2016-08-08 DIAGNOSIS — M6281 Muscle weakness (generalized): Secondary | ICD-10-CM

## 2016-08-08 NOTE — Therapy (Signed)
[facility] [address] [place], [place], [address] Phone: [phone]   Fax:  [phone]  Physical Therapy Treatment  Patient Details  Name: Micheal Fox MRN: [id number] Date of Birth: [birth year] Referring Provider: Elita Quick, MD  Encounter Date: 08/08/2016      PT End of Session - 08/08/16 1436    Visit Number 20   Number of Visits 28   Date for PT Re-Evaluation 09/19/16   Authorization Type gcodes at 28th visit; KX at 15th visit   PT Start Time 1400   PT Stop Time 1450   PT Time Calculation (min) 50 min   Activity Tolerance Patient tolerated treatment well   Behavior During Therapy [facility] for tasks assessed/performed      Past Medical History:  Diagnosis Date  . CAD (coronary artery disease)   . Cancer [facility])    prostate  . COPD (chronic obstructive pulmonary disease) ([place])   . Diabetes mellitus without complication ([place])   . Gout   . Hyperlipidemia   . Myocardial infarction [facility])     Past Surgical History:  Procedure Laterality Date  . PROSTATE SURGERY     prostatectomy about 11 years ago    There were no vitals filed for this visit.      Subjective Assessment - 08/08/16 1402    Subjective My left leg is hurting a lot today all the way down the back of the leg today.  It was feeling better after the previous   Limitations Sitting;Standing;Walking   Patient Stated Goals stop leakage and be able to get back to the gym and have intercourse with my wife   Currently in Pain? Yes   Pain Score 8                          [facility] Adult PT Treatment/Exercise - 08/08/16 0001      Knee/Hip Exercises: Prone   Hamstring Curl 10 reps  needed hip flexion to perform, improved after needling     Moist Heat Therapy   Number Minutes Moist Heat 15 Minutes   Moist Heat Location Lumbar Spine     Electrical Stimulation   Electrical Stimulation Location left lumbar   Electrical  Stimulation Action IFC   Electrical Stimulation Parameters to tolerance 15 min   Electrical Stimulation Goals Pain     Manual Therapy   Manual Therapy Soft tissue mobilization   Soft tissue mobilization left glutes, lumbar erectors and QL, plantar surface of foot.          Trigger Point Dry Needling - 08/08/16 1445    Consent Given? Yes   Education Handout Provided Yes   Muscles Treated Lower Body Gluteus minimus;Gluteus maximus  left lumbar multifidi   Gluteus Maximus Response Twitch response elicited;Palpable increased muscle length   Gluteus Minimus Response Twitch response elicited;Palpable increased muscle length                PT Short Term Goals - 08/08/16 1441      PT SHORT TERM GOAL #2   Title able to decrease pad usage to 3 per day   Baseline 3-4   Period Weeks   Status On-going           PT Long Term Goals - 08/08/16 1441      PT LONG TERM GOAL #1   Title FOTO limitation 43% or less   Time 8   Period Weeks  Status On-going     PT LONG TERM GOAL #2   Title independent with advanced HEP   Time 8   Period Weeks   Status On-going     PT LONG TERM GOAL #3   Title Able to identify the sensation of needing to use the bathroom   Time 16   Period Weeks   Status On-going     PT LONG TERM GOAL #5   Title able to hold urine for 2 hours without assistive device   Time 16   Period Weeks   Status On-going               Plan - 08/08/16 1437    Clinical Impression Statement Patient has difficulty with hamstring activation, hip extension, ankle eversion and ankle plantarflexion.  Pt has pain down leg in all these areas.  Pt reports improvement after PT treatment.  Skilled PT will be beneficial to improve muscle strength and function for improved gait and functional activities.  PT recommends pt to obtain at home TENS unit for pain management.   Clinical Impairments Affecting Rehab Potential prostate cancer, prostatectomy, pelvic radiation    PT Treatment/Interventions ADLs/Self Care Home Management;Biofeedback;Cryotherapy;Electrical Stimulation;Moist Heat;Patient/family education;Therapeutic activities;Therapeutic exercise;Manual techniques;Neuromuscular re-education;Dry needling;Taping;Passive range of motion   PT Next Visit Plan assess response to dry needle #3, manual and estim as needed strengthening as tolerated   Consulted and Agree with Plan of Care Patient      Patient will benefit from skilled therapeutic intervention in order to improve the following deficits and impairments:  Decreased activity tolerance, Decreased endurance, Decreased strength, Difficulty walking, Pain, Postural dysfunction, Increased muscle spasms  Visit Diagnosis: Other muscle spasm  Muscle weakness (generalized)     Problem List There are no active problems to display for this patient.   Zannie Cove, PT 08/08/2016, 7:28 PM  [facility] [address], [place], [place], [address] Phone: [phone]   Fax:  [phone]  Name: Micheal Fox MRN: [id number] Date of Birth: Apr 15, 1940

## 2016-08-15 ENCOUNTER — Ambulatory Visit: Payer: Medicare Other | Admitting: Physical Therapy

## 2016-08-15 DIAGNOSIS — M62838 Other muscle spasm: Secondary | ICD-10-CM | POA: Diagnosis not present

## 2016-08-15 DIAGNOSIS — M6281 Muscle weakness (generalized): Secondary | ICD-10-CM

## 2016-08-15 NOTE — Therapy (Signed)
[facility] [address] [place], [place], [address] Phone: [phone]   Fax:  [phone]  Physical Therapy Treatment  Patient Details  Name: Micheal Fox MRN: [id number] Date of Birth: 1940/04/07 Referring Provider: Elita Quick, MD  Encounter Date: 08/15/2016      PT End of Session - 08/15/16 1359    Visit Number 21   Number of Visits 28   Date for PT Re-Evaluation 09/19/16   Authorization Type gcodes at 28th visit; KX at 15th visit   PT Start Time 1149   PT Stop Time 1233   PT Time Calculation (min) 44 min   Activity Tolerance Patient tolerated treatment well   Behavior During Therapy [facility] for tasks assessed/performed      Past Medical History:  Diagnosis Date  . CAD (coronary artery disease)   . Cancer [facility])    prostate  . COPD (chronic obstructive pulmonary disease) ([place])   . Diabetes mellitus without complication ([place])   . Gout   . Hyperlipidemia   . Myocardial infarction [facility]     Past Surgical History:  Procedure Laterality Date  . PROSTATE SURGERY     prostatectomy about 11 years ago    There were no vitals filed for this visit.      Subjective Assessment - 08/15/16 1348    Subjective I am having foot drop and tripping over that leg more.  My pain is a lot better and haven't been having pain as much or needing to take the pain medicine as much.  The issue with my bowels is better.   Patient is accompained by: Family member   Pertinent History prostate cancer, prostatectomy   Limitations Sitting;Standing;Walking   Patient Stated Goals stop leakage and be able to get back to the gym and have intercourse with my wife   Currently in Pain? No/denies                         [facility] Adult PT Treatment/Exercise - 08/15/16 0001      Therapeutic Activites    Therapeutic Activities ADL's   ADL's instructed on use of cane during gait     Manual Therapy   Manual Therapy  Soft tissue mobilization   Soft tissue mobilization left glutes, lumbar erectors and QL, plantar surface of foot., peroneal muscles          Trigger Point Dry Needling - 08/15/16 1350    Consent Given? Yes   Muscles Treated Lower Body --  left lumbar paraspinals and multifidi   Gluteus Maximus Response Twitch response elicited;Palpable increased muscle length  left lumbar paraspinals and multifidi                PT Short Term Goals - 08/08/16 1441      PT SHORT TERM GOAL #2   Title able to decrease pad usage to 3 per day   Baseline 3-4   Period Weeks   Status On-going           PT Long Term Goals - 08/08/16 1441      PT LONG TERM GOAL #1   Title FOTO limitation 43% or less   Time 8   Period Weeks   Status On-going     PT LONG TERM GOAL #2   Title independent with advanced HEP   Time 8   Period Weeks   Status On-going     PT LONG TERM GOAL #3  Title Able to identify the sensation of needing to use the bathroom   Time 16   Period Weeks   Status On-going     PT LONG TERM GOAL #5   Title able to hold urine for 2 hours without assistive device   Time 16   Period Weeks   Status On-going               Plan - 08/15/16 1400    Clinical Impression Statement Patient was educated in use of cane for improved stability during gait and so he will be able to tolerate walking greater distances. Pt continues to have Lt LE weakness, but pain is improving.  PT suspects something more going on with nerves and discussed this with the patient.  He feels he is improving with dry needling.  Pt continues to need skilled PT for improved muscle coordination in order to return to walking with reduced risk of falls   Rehab Potential Excellent   Clinical Impairments Affecting Rehab Potential prostate cancer, prostatectomy, pelvic radiation   PT Treatment/Interventions ADLs/Self Care Home Management;Biofeedback;Cryotherapy;Electrical Stimulation;Moist Heat;Patient/family  education;Therapeutic activities;Therapeutic exercise;Manual techniques;Neuromuscular re-education;Dry needling;Taping;Passive range of motion   PT Next Visit Plan f/u with gait training with cane, assess response to dry needle #4, manual and estim as needed strengthening as tolerated   Consulted and Agree with Plan of Care Patient      Patient will benefit from skilled therapeutic intervention in order to improve the following deficits and impairments:  Decreased activity tolerance, Decreased endurance, Decreased strength, Difficulty walking, Pain, Postural dysfunction, Increased muscle spasms  Visit Diagnosis: Other muscle spasm  Muscle weakness (generalized)     Problem List There are no active problems to display for this patient.   Zannie Cove, PT 08/15/2016, 2:38 PM  [facility] [address] [place], [place], [address] Phone: [phone]   Fax:  [phone]  Name: Micheal Fox MRN: [id number] Date of Birth: [birth year]

## 2016-08-22 ENCOUNTER — Encounter: Payer: Self-pay | Admitting: Physical Therapy

## 2016-08-22 ENCOUNTER — Ambulatory Visit: Payer: Medicare Other | Admitting: Physical Therapy

## 2016-08-22 DIAGNOSIS — M6281 Muscle weakness (generalized): Secondary | ICD-10-CM

## 2016-08-22 DIAGNOSIS — M62838 Other muscle spasm: Secondary | ICD-10-CM

## 2016-08-22 NOTE — Therapy (Signed)
[facility] [address] [place], [place], [address] Phone: [phone]   Fax:  [phone]  Physical Therapy Treatment  Patient Details  Name: Schawn Byas MRN: [id number] Date of Birth: October 20, 1940 Referring Provider: Elita Quick, MD  Encounter Date: 08/22/2016      PT End of Session - 08/22/16 1137    Visit Number 22   Number of Visits 28   Date for PT Re-Evaluation 09/19/16   PT Start Time 1103   PT Stop Time 1145   PT Time Calculation (min) 42 min   Activity Tolerance Patient tolerated treatment well   Behavior During Therapy [facility] for tasks assessed/performed      Past Medical History:  Diagnosis Date  . CAD (coronary artery disease)   . Cancer [facility])    prostate  . COPD (chronic obstructive pulmonary disease) ([place])   . Diabetes mellitus without complication ([place])   . Gout   . Hyperlipidemia   . Myocardial infarction [facility])     Past Surgical History:  Procedure Laterality Date  . PROSTATE SURGERY     prostatectomy about 11 years ago    There were no vitals filed for this visit.      Subjective Assessment - 08/22/16 1111    Subjective Yesterday was the worst day.  The hip and foot hurt and the foot swells   Pertinent History prostate cancer, prostatectomy   Limitations Sitting;Standing;Walking   Patient Stated Goals stop leakage and be able to get back to the gym and have intercourse with my wife   Currently in Pain? Yes                         [facility] Adult PT Treatment/Exercise - 08/22/16 0001      Knee/Hip Exercises: Seated   Other Seated Knee/Hip Exercises ankle plantarfelxion, dorsiflexion, circles - 20x each     Manual Therapy   Manual Therapy Soft tissue mobilization   Soft tissue mobilization left glutes, lumbar erectors and QL, plantar surface of foot., peroneal muscles          Trigger Point Dry Needling - 08/22/16 1423    Consent Given? Yes   Muscles  Treated Lower Body --  bilat lumbar multifidi; Lt QL   Gluteus Maximus Response Twitch response elicited;Palpable increased muscle length   Gluteus Minimus Response Twitch response elicited;Palpable increased muscle length   Piriformis Response Twitch response elicited;Palpable increased muscle length                PT Short Term Goals - 08/22/16 1419      PT SHORT TERM GOAL #2   Title able to decrease pad usage to 3 per day   Period Weeks   Status On-going           PT Long Term Goals - 08/22/16 1420      PT LONG TERM GOAL #1   Title FOTO limitation 43% or less   Time 8   Period Weeks   Status On-going     PT LONG TERM GOAL #2   Title independent with advanced HEP   Period Weeks   Status On-going     PT LONG TERM GOAL #3   Title Able to identify the sensation of needing to use the bathroom   Baseline have been able to have a full bladder in the morning   Status On-going     PT LONG TERM GOAL #4  Title able to reduce leakage to use only 2 pads per day.     Time 16   Period Weeks   Status On-going               Plan - 08/22/16 1146    Clinical Impression Statement Patient reports relief after needling today.  He was educated and performed ankle exercises in sitting.  Pt demonstrates significant weakness of left LE unable to perform heel or toe raise in sitting without modification.  Pt was educated on being able to get up to 1-2 more visits due to lack of progress with strength at this time.  Pt will benefit from skilled PT to work on reduced muscle spasms and transition to HEP.  Pt recommended to return to doctor as well and get massage therapy for pain management.   Clinical Impairments Affecting Rehab Potential prostate cancer, prostatectomy, pelvic radiation   PT Treatment/Interventions ADLs/Self Care Home Management;Biofeedback;Cryotherapy;Electrical Stimulation;Moist Heat;Patient/family education;Therapeutic activities;Therapeutic exercise;Manual  techniques;Neuromuscular re-education;Dry needling;Taping;Passive range of motion   PT Next Visit Plan discuss discharge in next 2 visits due to it being 6th dry needling treatment without significant results   Recommended Other Services orders signed   Consulted and Agree with Plan of Care Patient      Patient will benefit from skilled therapeutic intervention in order to improve the following deficits and impairments:  Decreased activity tolerance, Decreased endurance, Decreased strength, Difficulty walking, Pain, Postural dysfunction, Increased muscle spasms  Visit Diagnosis: Other muscle spasm  Muscle weakness (generalized)     Problem List There are no active problems to display for this patient.   Zannie Cove, PT 08/22/2016, 2:25 PM  [facility] [address], [place], [place], [address] Phone: [phone]   Fax:  [phone]  Name: Cru Kritikos MRN: [id number] Date of Birth: 30-Dec-1940

## 2016-08-29 ENCOUNTER — Ambulatory Visit: Payer: Medicare Other | Attending: Gastroenterology | Admitting: Physical Therapy

## 2016-08-29 ENCOUNTER — Encounter: Payer: Self-pay | Admitting: Physical Therapy

## 2016-08-29 DIAGNOSIS — M62838 Other muscle spasm: Secondary | ICD-10-CM

## 2016-08-29 DIAGNOSIS — M6281 Muscle weakness (generalized): Secondary | ICD-10-CM | POA: Diagnosis present

## 2016-08-29 NOTE — Therapy (Signed)
[facility] [address], [place], [place], [address] Phone: [phone]   Fax:  [phone]  Physical Therapy Treatment  Patient Details  Name: Micheal Fox MRN: [id number] Date of Birth: [birth year] Referring Provider: Elita Quick, MD  Encounter Date: 08/29/2016      PT End of Session - 08/29/16 1149    Visit Number 23   Number of Visits 28   Date for PT Re-Evaluation 09/19/16   Authorization Type gcodes at 28th visit; KX at 15th visit   PT Start Time 1149   PT Stop Time 1233   PT Time Calculation (min) 44 min   Activity Tolerance Patient tolerated treatment well   Behavior During Therapy [facility] for tasks assessed/performed      Past Medical History:  Diagnosis Date  . CAD (coronary artery disease)   . Cancer [facility])    prostate  . COPD (chronic obstructive pulmonary disease) ([place])   . Diabetes mellitus without complication ([place])   . Gout   . Hyperlipidemia   . Myocardial infarction [facility])     Past Surgical History:  Procedure Laterality Date  . PROSTATE SURGERY     prostatectomy about 11 years ago    There were no vitals filed for this visit.      Subjective Assessment - 08/29/16 1224    Subjective Patient reports his pain is 50% improved overall.  Denies pain and is just cocerned about weakness.   Pertinent History prostate cancer, prostatectomy   Limitations Sitting;Standing;Walking   Patient Stated Goals stop leakage and be able to get back to the gym and have intercourse with my wife   Currently in Pain? No/denies                         [facility] Adult PT Treatment/Exercise - 08/29/16 0001      Knee/Hip Exercises: Seated   Long Arc Quad Strengthening;Left;15 reps   Other Seated Knee/Hip Exercises ankle plantarfelxion, dorsiflexion, circles - 20x each     Manual Therapy   Manual Therapy Soft tissue mobilization   Soft tissue mobilization left glutes, lumbar erectors and  QL, plantar surface of foot., peroneal muscles          Trigger Point Dry Needling - 08/29/16 1218    Muscles Treated Lower Body --  multifidi and lumbar paraspinals                PT Short Term Goals - 08/29/16 1226      PT SHORT TERM GOAL #1   Title independent with initial HEP   Time 4   Period Weeks   Status Achieved     PT SHORT TERM GOAL #2   Title able to decrease pad usage to 3 per day   Time 4   Period Weeks   Status Not Met     PT SHORT TERM GOAL #3   Title able to have BM with 50% less difficulty due to ability to relax pelvic floor and demonstrate improved coordination of muscles   Time 4   Period Weeks   Status Achieved     PT SHORT TERM GOAL #4   Title pain reduced by 50% due to decreased muscle spasms in pelvic floor   Time 4   Period Weeks   Status Achieved           PT Long Term Goals - 08/29/16 1226      PT LONG TERM  GOAL #1   Title FOTO limitation 43% or less   Time 8   Period Weeks   Status Not Met     PT LONG TERM GOAL #2   Title independent with advanced HEP   Time 8   Period Weeks   Status Achieved     PT LONG TERM GOAL #3   Title Able to identify the sensation of needing to use the bathroom   Baseline have been able to have a full bladder in the morning   Time 16   Period Weeks   Status Partially Met     PT LONG TERM GOAL #4   Title able to reduce leakage to use only 2 pads per day.     Time 16   Period Weeks   Status Not Met     PT LONG TERM GOAL #5   Title able to hold urine for 2 hours without assistive device   Baseline notice having a full bladder in the morning when I used to drip a lot at night   Time 16   Period Weeks   Status Partially Met               Plan - 09-02-16 1227    Clinical Impression Statement Patient has conintued to report consistently maintaining reduced pain.  He still has not demonstrated improved strength stumbles as he went to sit on the chair.  PT is not making any  progress with strength at this time and is recommended to return to MD for further testing.   Rehab Potential Excellent   Clinical Impairments Affecting Rehab Potential prostate cancer, prostatectomy, pelvic radiation   PT Treatment/Interventions ADLs/Self Care Home Management;Biofeedback;Cryotherapy;Electrical Stimulation;Moist Heat;Patient/family education;Therapeutic activities;Therapeutic exercise;Manual techniques;Neuromuscular re-education;Dry needling;Taping;Passive range of motion   PT Next Visit Plan discharge today   Recommended Other Services return to MD for further testing   Consulted and Agree with Plan of Care Patient      Patient will benefit from skilled therapeutic intervention in order to improve the following deficits and impairments:  Decreased activity tolerance, Decreased endurance, Decreased strength, Difficulty walking, Pain, Postural dysfunction, Increased muscle spasms  Visit Diagnosis: Other muscle spasm  Muscle weakness (generalized)       G-Codes - Sep 02, 2016 1233    Functional Assessment Tool Used (Outpatient Only) FOTO   Functional Limitation Self care   Self Care Current Status (I9803) At least 60 percent but less than 80 percent impaired, limited or restricted   Self Care Goal Status (H7131) At least 40 percent but less than 60 percent impaired, limited or restricted   Self Care Discharge Status [phone]) At least 60 percent but less than 80 percent impaired, limited or restricted   Functional Assessment Tool Used Clinical judgement      Problem List There are no active problems to display for this patient.   Vincente Poli, PT 09-02-2016, 2:03 PM  [facility] [address] [place], [place], [address] Phone: [phone]   Fax:  [phone]  Name: Micheal Fox MRN: [id number] Date of Birth: [birth year]  PHYSICAL THERAPY DISCHARGE SUMMARY  Visits from Start of Care: 23  Current  functional level related to goals / functional outcomes: See above   Remaining deficits: See above   Education / Equipment: HEP  Plan: Patient agrees to discharge.  Patient goals were not met. Patient is being discharged due to lack of progress.  ?????         [employer]  Crosser, PT 08/29/16 2:03 PM

## 2016-09-05 ENCOUNTER — Encounter: Payer: Medicare Other | Admitting: Physical Therapy

## 2016-09-12 ENCOUNTER — Encounter: Payer: Medicare Other | Admitting: Physical Therapy

## 2016-09-19 ENCOUNTER — Encounter: Payer: Medicare Other | Admitting: Physical Therapy

## 2016-09-26 ENCOUNTER — Encounter: Payer: Medicare Other | Admitting: Physical Therapy

## 2017-05-28 HISTORY — PX: OTHER SURGICAL HISTORY: SHX169

## 2017-06-12 DIAGNOSIS — S065X9A Traumatic subdural hemorrhage with loss of consciousness of unspecified duration, initial encounter: Secondary | ICD-10-CM

## 2017-06-12 DIAGNOSIS — S065XAA Traumatic subdural hemorrhage with loss of consciousness status unknown, initial encounter: Secondary | ICD-10-CM

## 2017-06-12 HISTORY — DX: Traumatic subdural hemorrhage with loss of consciousness of unspecified duration, initial encounter: S06.5X9A

## 2017-06-12 HISTORY — DX: Traumatic subdural hemorrhage with loss of consciousness status unknown, initial encounter: S06.5XAA

## 2017-08-12 ENCOUNTER — Emergency Department (HOSPITAL_COMMUNITY): Payer: Medicare Other

## 2017-08-12 ENCOUNTER — Encounter (HOSPITAL_COMMUNITY): Payer: Self-pay | Admitting: Emergency Medicine

## 2017-08-12 ENCOUNTER — Inpatient Hospital Stay (HOSPITAL_COMMUNITY)
Admission: EM | Admit: 2017-08-12 | Discharge: 2017-08-22 | DRG: 659 | Disposition: A | Discharge status: Hospice | Payer: Medicare Other | Attending: Family Medicine | Admitting: Family Medicine

## 2017-08-12 DIAGNOSIS — F05 Delirium due to known physiological condition: Secondary | ICD-10-CM | POA: Diagnosis present

## 2017-08-12 DIAGNOSIS — R402344 Coma scale, best motor response, flexion withdrawal, 24 hours or more after hospital admission: Secondary | ICD-10-CM | POA: Diagnosis not present

## 2017-08-12 DIAGNOSIS — Z515 Encounter for palliative care: Secondary | ICD-10-CM

## 2017-08-12 DIAGNOSIS — L89610 Pressure ulcer of right heel, unstageable: Secondary | ICD-10-CM | POA: Diagnosis not present

## 2017-08-12 DIAGNOSIS — Z66 Do not resuscitate: Secondary | ICD-10-CM

## 2017-08-12 DIAGNOSIS — Z9841 Cataract extraction status, right eye: Secondary | ICD-10-CM

## 2017-08-12 DIAGNOSIS — R4182 Altered mental status, unspecified: Secondary | ICD-10-CM

## 2017-08-12 DIAGNOSIS — Z419 Encounter for procedure for purposes other than remedying health state, unspecified: Secondary | ICD-10-CM

## 2017-08-12 DIAGNOSIS — R0989 Other specified symptoms and signs involving the circulatory and respiratory systems: Secondary | ICD-10-CM

## 2017-08-12 DIAGNOSIS — E785 Hyperlipidemia, unspecified: Secondary | ICD-10-CM | POA: Diagnosis present

## 2017-08-12 DIAGNOSIS — R000 Tachycardia, unspecified: Secondary | ICD-10-CM | POA: Diagnosis present

## 2017-08-12 DIAGNOSIS — E440 Moderate protein-calorie malnutrition: Secondary | ICD-10-CM | POA: Diagnosis not present

## 2017-08-12 DIAGNOSIS — B9620 Unspecified Escherichia coli [E. coli] as the cause of diseases classified elsewhere: Secondary | ICD-10-CM | POA: Diagnosis present

## 2017-08-12 DIAGNOSIS — I959 Hypotension, unspecified: Secondary | ICD-10-CM | POA: Diagnosis not present

## 2017-08-12 DIAGNOSIS — N10 Acute tubulo-interstitial nephritis: Secondary | ICD-10-CM | POA: Diagnosis not present

## 2017-08-12 DIAGNOSIS — R402352 Coma scale, best motor response, localizes pain, at arrival to emergency department: Secondary | ICD-10-CM | POA: Diagnosis present

## 2017-08-12 DIAGNOSIS — M5430 Sciatica, unspecified side: Secondary | ICD-10-CM | POA: Diagnosis present

## 2017-08-12 DIAGNOSIS — N179 Acute kidney failure, unspecified: Secondary | ICD-10-CM

## 2017-08-12 DIAGNOSIS — I252 Old myocardial infarction: Secondary | ICD-10-CM

## 2017-08-12 DIAGNOSIS — R402142 Coma scale, eyes open, spontaneous, at arrival to emergency department: Secondary | ICD-10-CM | POA: Diagnosis present

## 2017-08-12 DIAGNOSIS — N183 Chronic kidney disease, stage 3 (moderate): Secondary | ICD-10-CM | POA: Diagnosis present

## 2017-08-12 DIAGNOSIS — E1122 Type 2 diabetes mellitus with diabetic chronic kidney disease: Secondary | ICD-10-CM | POA: Diagnosis present

## 2017-08-12 DIAGNOSIS — I129 Hypertensive chronic kidney disease with stage 1 through stage 4 chronic kidney disease, or unspecified chronic kidney disease: Secondary | ICD-10-CM | POA: Diagnosis present

## 2017-08-12 DIAGNOSIS — Z87442 Personal history of urinary calculi: Secondary | ICD-10-CM

## 2017-08-12 DIAGNOSIS — Z91048 Other nonmedicinal substance allergy status: Secondary | ICD-10-CM

## 2017-08-12 DIAGNOSIS — I10 Essential (primary) hypertension: Secondary | ICD-10-CM | POA: Diagnosis present

## 2017-08-12 DIAGNOSIS — I69098 Other sequelae following nontraumatic subarachnoid hemorrhage: Secondary | ICD-10-CM

## 2017-08-12 DIAGNOSIS — H01006 Unspecified blepharitis left eye, unspecified eyelid: Secondary | ICD-10-CM | POA: Diagnosis present

## 2017-08-12 DIAGNOSIS — N1330 Unspecified hydronephrosis: Secondary | ICD-10-CM | POA: Diagnosis present

## 2017-08-12 DIAGNOSIS — N1339 Other hydronephrosis: Secondary | ICD-10-CM | POA: Diagnosis not present

## 2017-08-12 DIAGNOSIS — J449 Chronic obstructive pulmonary disease, unspecified: Secondary | ICD-10-CM | POA: Diagnosis present

## 2017-08-12 DIAGNOSIS — R627 Adult failure to thrive: Secondary | ICD-10-CM | POA: Diagnosis not present

## 2017-08-12 DIAGNOSIS — Z923 Personal history of irradiation: Secondary | ICD-10-CM

## 2017-08-12 DIAGNOSIS — N390 Urinary tract infection, site not specified: Secondary | ICD-10-CM | POA: Diagnosis not present

## 2017-08-12 DIAGNOSIS — Z87440 Personal history of urinary (tract) infections: Secondary | ICD-10-CM

## 2017-08-12 DIAGNOSIS — E1151 Type 2 diabetes mellitus with diabetic peripheral angiopathy without gangrene: Secondary | ICD-10-CM | POA: Diagnosis present

## 2017-08-12 DIAGNOSIS — Z888 Allergy status to other drugs, medicaments and biological substances status: Secondary | ICD-10-CM | POA: Diagnosis not present

## 2017-08-12 DIAGNOSIS — Z961 Presence of intraocular lens: Secondary | ICD-10-CM | POA: Diagnosis present

## 2017-08-12 DIAGNOSIS — Z960 Presence of urogenital implants: Secondary | ICD-10-CM

## 2017-08-12 DIAGNOSIS — I2510 Atherosclerotic heart disease of native coronary artery without angina pectoris: Secondary | ICD-10-CM | POA: Diagnosis present

## 2017-08-12 DIAGNOSIS — E870 Hyperosmolality and hypernatremia: Secondary | ICD-10-CM | POA: Diagnosis not present

## 2017-08-12 DIAGNOSIS — R402244 Coma scale, best verbal response, confused conversation, 24 hours or more after hospital admission: Secondary | ICD-10-CM | POA: Diagnosis not present

## 2017-08-12 DIAGNOSIS — E43 Unspecified severe protein-calorie malnutrition: Secondary | ICD-10-CM | POA: Diagnosis not present

## 2017-08-12 DIAGNOSIS — Z9079 Acquired absence of other genital organ(s): Secondary | ICD-10-CM | POA: Diagnosis not present

## 2017-08-12 DIAGNOSIS — M24561 Contracture, right knee: Secondary | ICD-10-CM | POA: Diagnosis present

## 2017-08-12 DIAGNOSIS — I629 Nontraumatic intracranial hemorrhage, unspecified: Secondary | ICD-10-CM | POA: Diagnosis not present

## 2017-08-12 DIAGNOSIS — Z955 Presence of coronary angioplasty implant and graft: Secondary | ICD-10-CM

## 2017-08-12 DIAGNOSIS — R509 Fever, unspecified: Secondary | ICD-10-CM | POA: Diagnosis not present

## 2017-08-12 DIAGNOSIS — E873 Alkalosis: Secondary | ICD-10-CM | POA: Diagnosis not present

## 2017-08-12 DIAGNOSIS — G9340 Encephalopathy, unspecified: Secondary | ICD-10-CM | POA: Diagnosis not present

## 2017-08-12 DIAGNOSIS — B179 Acute viral hepatitis, unspecified: Secondary | ICD-10-CM | POA: Diagnosis not present

## 2017-08-12 DIAGNOSIS — H01003 Unspecified blepharitis right eye, unspecified eyelid: Secondary | ICD-10-CM | POA: Diagnosis present

## 2017-08-12 DIAGNOSIS — I609 Nontraumatic subarachnoid hemorrhage, unspecified: Secondary | ICD-10-CM | POA: Diagnosis not present

## 2017-08-12 DIAGNOSIS — E7220 Disorder of urea cycle metabolism, unspecified: Secondary | ICD-10-CM | POA: Diagnosis not present

## 2017-08-12 DIAGNOSIS — M2450 Contracture, unspecified joint: Secondary | ICD-10-CM | POA: Clinically undetermined

## 2017-08-12 DIAGNOSIS — J189 Pneumonia, unspecified organism: Secondary | ICD-10-CM

## 2017-08-12 DIAGNOSIS — N136 Pyonephrosis: Principal | ICD-10-CM | POA: Diagnosis present

## 2017-08-12 DIAGNOSIS — E872 Acidosis: Secondary | ICD-10-CM | POA: Diagnosis not present

## 2017-08-12 DIAGNOSIS — M24562 Contracture, left knee: Secondary | ICD-10-CM | POA: Diagnosis present

## 2017-08-12 DIAGNOSIS — R402222 Coma scale, best verbal response, incomprehensible words, at arrival to emergency department: Secondary | ICD-10-CM | POA: Diagnosis present

## 2017-08-12 DIAGNOSIS — R32 Unspecified urinary incontinence: Secondary | ICD-10-CM | POA: Diagnosis present

## 2017-08-12 DIAGNOSIS — F17210 Nicotine dependence, cigarettes, uncomplicated: Secondary | ICD-10-CM | POA: Diagnosis present

## 2017-08-12 DIAGNOSIS — R251 Tremor, unspecified: Secondary | ICD-10-CM | POA: Diagnosis present

## 2017-08-12 DIAGNOSIS — Z7982 Long term (current) use of aspirin: Secondary | ICD-10-CM

## 2017-08-12 DIAGNOSIS — Z9842 Cataract extraction status, left eye: Secondary | ICD-10-CM

## 2017-08-12 DIAGNOSIS — N131 Hydronephrosis with ureteral stricture, not elsewhere classified: Secondary | ICD-10-CM | POA: Diagnosis not present

## 2017-08-12 DIAGNOSIS — H0100A Unspecified blepharitis right eye, upper and lower eyelids: Secondary | ICD-10-CM | POA: Diagnosis not present

## 2017-08-12 DIAGNOSIS — B390 Acute pulmonary histoplasmosis capsulati: Secondary | ICD-10-CM | POA: Diagnosis not present

## 2017-08-12 DIAGNOSIS — Z8546 Personal history of malignant neoplasm of prostate: Secondary | ICD-10-CM

## 2017-08-12 DIAGNOSIS — Z79891 Long term (current) use of opiate analgesic: Secondary | ICD-10-CM

## 2017-08-12 DIAGNOSIS — G9389 Other specified disorders of brain: Secondary | ICD-10-CM | POA: Diagnosis present

## 2017-08-12 DIAGNOSIS — R402144 Coma scale, eyes open, spontaneous, 24 hours or more after hospital admission: Secondary | ICD-10-CM | POA: Diagnosis not present

## 2017-08-12 DIAGNOSIS — Z7902 Long term (current) use of antithrombotics/antiplatelets: Secondary | ICD-10-CM

## 2017-08-12 DIAGNOSIS — Z79899 Other long term (current) drug therapy: Secondary | ICD-10-CM

## 2017-08-12 HISTORY — DX: Traumatic subdural hemorrhage with loss of consciousness of unspecified duration, initial encounter: S06.5X9A

## 2017-08-12 HISTORY — DX: Type 2 diabetes mellitus without complications: E11.9

## 2017-08-12 HISTORY — DX: Headache: R51

## 2017-08-12 HISTORY — DX: Essential (primary) hypertension: I10

## 2017-08-12 HISTORY — DX: Headache, unspecified: R51.9

## 2017-08-12 HISTORY — DX: Chronic kidney disease, stage 3 unspecified: N18.30

## 2017-08-12 HISTORY — DX: Malignant neoplasm of prostate: C61

## 2017-08-12 HISTORY — DX: Chronic kidney disease, stage 3 (moderate): N18.3

## 2017-08-12 HISTORY — DX: Foot drop, left foot: M21.372

## 2017-08-12 HISTORY — DX: Sciatica, left side: M54.32

## 2017-08-12 LAB — CBC WITH DIFFERENTIAL/PLATELET
Abs Immature Granulocytes: 0 10*3/uL (ref 0.0–0.1)
BASOS ABS: 0.1 10*3/uL (ref 0.0–0.1)
BASOS PCT: 1 %
EOS PCT: 1 %
Eosinophils Absolute: 0.1 10*3/uL (ref 0.0–0.7)
HCT: 37.4 % — ABNORMAL LOW (ref 39.0–52.0)
Hemoglobin: 11.8 g/dL — ABNORMAL LOW (ref 13.0–17.0)
Immature Granulocytes: 0 %
Lymphocytes Relative: 24 %
Lymphs Abs: 2.3 10*3/uL (ref 0.7–4.0)
MCH: 28.8 pg (ref 26.0–34.0)
MCHC: 31.6 g/dL (ref 30.0–36.0)
MCV: 91.2 fL (ref 78.0–100.0)
MONO ABS: 0.7 10*3/uL (ref 0.1–1.0)
Monocytes Relative: 7 %
Neutro Abs: 6.5 10*3/uL (ref 1.7–7.7)
Neutrophils Relative %: 67 %
PLATELETS: 314 10*3/uL (ref 150–400)
RBC: 4.1 MIL/uL — AB (ref 4.22–5.81)
RDW: 14.9 % (ref 11.5–15.5)
WBC: 9.6 10*3/uL (ref 4.0–10.5)

## 2017-08-12 LAB — URINALYSIS, ROUTINE W REFLEX MICROSCOPIC
BILIRUBIN URINE: NEGATIVE
Glucose, UA: NEGATIVE mg/dL
KETONES UR: NEGATIVE mg/dL
Nitrite: NEGATIVE
PH: 5 (ref 5.0–8.0)
Protein, ur: 100 mg/dL — AB
SPECIFIC GRAVITY, URINE: 1.012 (ref 1.005–1.030)
WBC, UA: >50 WBC/hpf — ABNORMAL HIGH (ref 0–5)

## 2017-08-12 LAB — I-STAT CHEM 8, ED
BUN: 80 mg/dL — ABNORMAL HIGH (ref 6–20)
CREATININE: 2.5 mg/dL — AB (ref 0.61–1.24)
Calcium, Ion: 1.15 mmol/L (ref 1.15–1.40)
Chloride: 109 mmol/L (ref 101–111)
GLUCOSE: 177 mg/dL — AB (ref 65–99)
HCT: 26 % — ABNORMAL LOW (ref 39.0–52.0)
HEMOGLOBIN: 8.8 g/dL — AB (ref 13.0–17.0)
Potassium: 6.8 mmol/L (ref 3.5–5.1)
SODIUM: 138 mmol/L (ref 135–145)
TCO2: 21 mmol/L — ABNORMAL LOW (ref 22–32)

## 2017-08-12 LAB — COMPREHENSIVE METABOLIC PANEL
ALK PHOS: 84 U/L (ref 38–126)
ALT: 12 U/L — ABNORMAL LOW (ref 17–63)
ANION GAP: 13 (ref 5–15)
AST: 26 U/L (ref 15–41)
Albumin: 3.7 g/dL (ref 3.5–5.0)
BUN: 62 mg/dL — ABNORMAL HIGH (ref 6–20)
CALCIUM: 10.1 mg/dL (ref 8.9–10.3)
CO2: 21 mmol/L — AB (ref 22–32)
Chloride: 103 mmol/L (ref 101–111)
Creatinine, Ser: 2.45 mg/dL — ABNORMAL HIGH (ref 0.61–1.24)
GFR calc non Af Amer: 24 mL/min — ABNORMAL LOW (ref >60)
GFR, EST AFRICAN AMERICAN: 27 mL/min — AB (ref >60)
Glucose, Bld: 173 mg/dL — ABNORMAL HIGH (ref 65–99)
POTASSIUM: 5.5 mmol/L — AB (ref 3.5–5.1)
SODIUM: 137 mmol/L (ref 135–145)
Total Bilirubin: 0.6 mg/dL (ref 0.3–1.2)
Total Protein: 8.9 g/dL — ABNORMAL HIGH (ref 6.5–8.1)

## 2017-08-12 LAB — I-STAT TROPONIN, ED: TROPONIN I, POC: 0.04 ng/mL (ref 0.00–0.08)

## 2017-08-12 LAB — I-STAT CG4 LACTIC ACID, ED: LACTIC ACID, VENOUS: 1.16 mmol/L (ref 0.5–1.9)

## 2017-08-12 MED ORDER — ACETAMINOPHEN 650 MG RE SUPP
650.0000 mg | Freq: Once | RECTAL | Status: AC
  Administered 2017-08-12: 650 mg via RECTAL
  Filled 2017-08-12: qty 1

## 2017-08-12 MED ORDER — SODIUM CHLORIDE 0.9 % IV SOLN
1.0000 g | Freq: Once | INTRAVENOUS | Status: AC
  Administered 2017-08-12: 1 g via INTRAVENOUS
  Filled 2017-08-12: qty 10

## 2017-08-12 MED ORDER — SODIUM CHLORIDE 0.9 % IV SOLN
INTRAVENOUS | Status: AC
  Administered 2017-08-12: 23:00:00 via INTRAVENOUS

## 2017-08-12 MED ORDER — ACETAMINOPHEN 650 MG RE SUPP
650.0000 mg | Freq: Four times a day (QID) | RECTAL | Status: DC | PRN
  Administered 2017-08-15 – 2017-08-22 (×3): 650 mg via RECTAL
  Filled 2017-08-12 (×6): qty 1

## 2017-08-12 MED ORDER — SODIUM CHLORIDE 0.9 % IV BOLUS
1000.0000 mL | Freq: Once | INTRAVENOUS | Status: AC
  Administered 2017-08-12: 1000 mL via INTRAVENOUS

## 2017-08-12 NOTE — ED Notes (Signed)
IV team at bedside, notified pt needs additional blood and additional line.

## 2017-08-12 NOTE — ED Notes (Signed)
Patient transported to CT 

## 2017-08-12 NOTE — ED Notes (Signed)
ED Provider at bedside. 

## 2017-08-12 NOTE — ED Notes (Signed)
Admitting, MD at bedside.  

## 2017-08-12 NOTE — ED Notes (Signed)
Family at bedside updated on plan of care. Pt family reported pt at baseline can speak in full sentences.

## 2017-08-12 NOTE — ED Provider Notes (Signed)
[facility] EMERGENCY DEPARTMENT Provider Note   CSN: [id number] Arrival date & time: 08/12/17  1555     History   Chief Complaint No chief complaint on file.   HPI Micheal Fox is a 77 y.o. male.  Level 5 caveat secondary to altered mental status.  He is currently at rehab after having had a stroke.  He was sent in because staff noticed that he was newly altered although it is unclear what his baseline is and when this changes occurred.  It sounds like he was able to speak before and is down not been speaking.  Patient is unable to give any history to me.  Seems to acknowledge when I speak to him and he makes little effort to try to speak but it is just quiet mumbling.  No family is present currently.  The history is provided by the EMS personnel and the nursing home.  Altered Mental Status   This is a new problem. Episode onset: unknown. Associated symptoms include somnolence. His past medical history is significant for diabetes and CVA.    Past Medical History:  Diagnosis Date  . CAD (coronary artery disease)   . Cancer [facility])    prostate  . COPD (chronic obstructive pulmonary disease) ([place])   . Diabetes mellitus without complication ([place])   . Gout   . Hyperlipidemia   . Myocardial infarction ([place])     There are no active problems to display for this patient.   Past Surgical History:  Procedure Laterality Date  . PROSTATE SURGERY     prostatectomy about 11 years ago        Home Medications    Prior to Admission medications   Medication Sig Start Date End Date Taking? Authorizing Provider  allopurinol (ZYLOPRIM) 300 MG tablet Take 300 mg by mouth daily.    [provider]  aspirin EC 81 MG tablet Take 81 mg by mouth daily.    [provider]  linaclotide (LINZESS) 145 MCG CAPS capsule Take 145 mcg by mouth daily before breakfast.    [provider]  lisinopril (PRINIVIL,ZESTRIL) 20 MG tablet Take 20 mg by mouth daily.     [provider]  metoprolol succinate (TOPROL-XL) 50 MG 24 hr tablet Take 50 mg by mouth daily. Take with or immediately following a meal.    [provider]  mirabegron ER (MYRBETRIQ) 25 MG TB24 tablet Take 25 mg by mouth daily.    [provider]  mirtazapine (REMERON) 15 MG tablet Take 15 mg by mouth at bedtime.    [provider]  nitroGLYCERIN (NITROSTAT) 0.4 MG SL tablet Place 0.4 mg under the tongue every 5 (five) minutes as needed for chest pain.    [provider]  polyethylene glycol (MIRALAX / GLYCOLAX) packet Take 17 g by mouth daily.    [provider]  pravastatin (PRAVACHOL) 40 MG tablet Take 40 mg by mouth daily.    [provider]  traMADol (ULTRAM) 50 MG tablet Take by mouth every 6 (six) hours as needed (leg pain).    [provider]    Family History No family history on file.  Social History Social History   Tobacco Use  . Smoking status: Current Every Day Smoker    Packs/day: 0.25    Years: 30.00    Pack years: 7.50    Types: Cigarettes  . Tobacco comment: weaning down on his own  Substance Use Topics  . Alcohol use: Not  on file  . Drug use: Not on file     Allergies   Patient has no allergy information on record.   Review of Systems Review of Systems  Unable to perform ROS: Mental status change     Physical Exam Updated Vital Signs BP (!) 143/71 (BP Location: Right Arm)   Pulse 92   Temp (S) (!) 100.9 F (38.3 C) (Rectal)   Resp 20   SpO2 96%   Physical Exam  Constitutional: He appears well-developed and well-nourished.  HENT:  Head: Normocephalic and atraumatic.  Right Ear: External ear normal.  Left Ear: External ear normal.  Mouth/Throat: Mucous membranes are dry.  Eyes: Pupils are equal, round, and reactive to light. Conjunctivae are normal.  Neck: Normal range of motion. Neck supple.  Cardiovascular: Normal rate, regular rhythm, normal heart sounds and intact  distal pulses.  No murmur heard. Pulmonary/Chest: Effort normal and breath sounds normal. No respiratory distress. He has no wheezes. He has no rales.  Abdominal: Soft. He exhibits no mass. There is no tenderness. There is no guarding.  Musculoskeletal: He exhibits no edema or deformity.  Neurological: He is alert. He exhibits abnormal muscle tone (increased tone all 4 extremities). GCS eye subscore is 4. GCS verbal subscore is 2. GCS motor subscore is 5.  Skin: Skin is warm and dry.  Psychiatric: He has a normal mood and affect.  Nursing note and vitals reviewed.    ED Treatments / Results  Labs (all labs ordered are listed, but only abnormal results are displayed) Labs Reviewed  URINALYSIS, ROUTINE W REFLEX MICROSCOPIC - Abnormal; Notable for the following components:      Result Value   APPearance CLOUDY (*)    Hgb urine dipstick LARGE (*)    Protein, ur 100 (*)    Leukocytes, UA LARGE (*)    RBC / HPF >50 (*)    WBC, UA >50 (*)    Bacteria, UA MANY (*)    All other components within normal limits  CBC WITH DIFFERENTIAL/PLATELET - Abnormal; Notable for the following components:   RBC 4.10 (*)    Hemoglobin 11.8 (*)    HCT 37.4 (*)    All other components within normal limits  COMPREHENSIVE METABOLIC PANEL - Abnormal; Notable for the following components:   Potassium 5.5 (*)    CO2 21 (*)    Glucose, Bld 173 (*)    BUN 62 (*)    Creatinine, Ser 2.45 (*)    Total Protein 8.9 (*)    ALT 12 (*)    GFR calc non Af Amer 24 (*)    GFR calc Af Amer 27 (*)    All other components within normal limits  BASIC METABOLIC PANEL - Abnormal; Notable for the following components:   CO2 21 (*)    Glucose, Bld 157 (*)    BUN 55 (*)    Creatinine, Ser 2.06 (*)    GFR calc non Af Amer 29 (*)    GFR calc Af Amer 34 (*)    All other components within normal limits  CBC - Abnormal; Notable for the following components:   RBC 3.96 (*)    Hemoglobin 11.3 (*)    HCT 35.6 (*)    All  other components within normal limits  I-STAT CHEM 8, ED - Abnormal; Notable for the following components:   Potassium 6.8 (*)    BUN 80 (*)    Creatinine, Ser 2.50 (*)    Glucose, Bld  177 (*)    TCO2 21 (*)    Hemoglobin 8.8 (*)    HCT 26.0 (*)    All other components within normal limits  CULTURE, BLOOD (ROUTINE X 2)  CULTURE, BLOOD (ROUTINE X 2)  URINE CULTURE  PROTIME-INR  APTT  CBC WITH DIFFERENTIAL/PLATELET  I-STAT CG4 LACTIC ACID, ED  I-STAT TROPONIN, ED    EKG EKG Interpretation  Date/Time:  Sunday August 12 2017 16:02:03 EDT Ventricular Rate:  90 PR Interval:  178 QRS Duration: 80 QT Interval:  344 QTC Calculation: 420 R Axis:   67 Text Interpretation:  Normal sinus rhythm Left ventricular hypertrophy with repolarization abnormality Abnormal ECG t wave inversions anterolateral no prior to compare with Confirmed by Aletta Edouard [phone]) on 08/12/2017 4:10:25 PM   Radiology Ct Abdomen Pelvis Wo Contrast  Result Date: 08/12/2017 CLINICAL DATA:  pyelonephritis EXAM: CT ABDOMEN AND PELVIS WITHOUT CONTRAST TECHNIQUE: Multidetector CT imaging of the abdomen and pelvis was performed following the standard protocol without IV contrast. COMPARISON:  CT 05/19/2015 FINDINGS: Lower chest: Lung bases are clear. Hepatobiliary: Central hepatic cysts. Gallbladder normal. No biliary duct dilatation. No IV contrast Pancreas: Pancreas is normal. No ductal dilatation. No pancreatic inflammation. Spleen: Normal spleen Adrenals/urinary tract: Adrenal glands normal. Ureteral stent within the LEFT renal pelvis extending to the bladder. Mild hydronephrosis and hydroureter on the LEFT. RIGHT ureter mildly dilated to lesser degree. Bladder normal Stomach/Bowel: Stomach, small bowel, appendix, and cecum are normal. The colon and rectosigmoid colon are normal. Vascular/Lymphatic: Abdominal aorta is normal caliber with atherosclerotic calcification. There is no retroperitoneal or periportal  lymphadenopathy. No pelvic lymphadenopathy. Reproductive: Prostate poorly defined. There calcifications and vascular clips prostate bed Other: No free fluid. Musculoskeletal: No aggressive osseous lesion. IMPRESSION: 1. Hydronephrosis and hydroureter on the LEFT with ureteral stent in place. 2. Minimal hydroureter on the RIGHT. 3. Bladder normal. 4. Postsurgical change in the prostate bed. Electronically Signed   By: Suzy Bouchard M.D.   On: 08/12/2017 22:09   Ct Head Wo Contrast  Result Date: 08/12/2017 CLINICAL DATA:  Altered level of consciousness. EXAM: CT HEAD WITHOUT CONTRAST TECHNIQUE: Contiguous axial images were obtained from the base of the skull through the vertex without intravenous contrast. COMPARISON:  None FINDINGS: Brain: No evidence of acute infarction, hemorrhage, hydrocephalus, extra-axial collection or mass lesion/mass effect. Focal area of low attenuation within the proximal cervical cord may represent a syrinx. There is mild diffuse low-attenuation within the subcortical and periventricular white matter compatible with chronic microvascular disease. Prominence of sulci and ventricles identified compatible with brain atrophy. Vascular: No hyperdense vessel or unexpected calcification. Skull: Normal. Negative for fracture or focal lesion. Sinuses/Orbits: No acute finding. Other: None IMPRESSION: 1. No acute intracranial abnormalities. 2. Chronic small vessel ischemic change and brain atrophy. Electronically Signed   By: Kerby Moors M.D.   On: 08/12/2017 17:09   Dg Chest Port 1 View  Result Date: 08/12/2017 CLINICAL DATA:  Fever.  Altered mental status. EXAM: PORTABLE CHEST 1 VIEW COMPARISON:  None FINDINGS: The heart size and mediastinal contours are within normal limits. Both lungs are clear. The visualized skeletal structures are unremarkable. IMPRESSION: No active disease. Electronically Signed   By: Kerby Moors M.D.   On: 08/12/2017 16:53    Procedures .Critical  Care Performed by: Hayden Rasmussen, MD Authorized by: Hayden Rasmussen, MD   Critical care provider statement:    Critical care time (minutes):  30   Critical care time was exclusive of:  Separately  billable procedures and treating other patients   Critical care was necessary to treat or prevent imminent or life-threatening deterioration of the following conditions:  CNS failure or compromise and sepsis   Critical care was time spent personally by me on the following activities:  Evaluation of patient's response to treatment, examination of patient, obtaining history from patient or surrogate, ordering and performing treatments and interventions, ordering and review of laboratory studies, ordering and review of radiographic studies, pulse oximetry, re-evaluation of patient's condition and review of old charts   I assumed direction of critical care for this patient from another provider in my specialty: no     (including critical care time)  Medications Ordered in ED Medications  aspirin EC tablet 81 mg (has no administration in time range)  metoprolol succinate (TOPROL-XL) 24 hr tablet 50 mg (has no administration in time range)  pravastatin (PRAVACHOL) tablet 40 mg (has no administration in time range)  0.9 %  sodium chloride infusion ( Intravenous New Bag/Given 08/12/17 2234)  acetaminophen (TYLENOL) suppository 650 mg (has no administration in time range)  acetaminophen (TYLENOL) suppository 650 mg (650 mg Rectal Given 08/12/17 1646)  sodium chloride 0.9 % bolus 1,000 mL (0 mLs Intravenous Stopped 08/12/17 1950)  cefTRIAXone (ROCEPHIN) 1 g in sodium chloride 0.9 % 100 mL IVPB (0 g Intravenous Stopped 08/12/17 2102)     Initial Impression / Assessment and Plan / ED Course  I have reviewed the triage vital signs and the nursing notes.  Pertinent labs & imaging results that were available during my care of the patient were reviewed by me and considered in my medical decision making (see  chart for details).  Clinical Course as of Aug 13 1049  Sun Aug 13, 6951  4843 76 year old male full code here with altered mental status of unclear duration.  It sounds like at baseline the patient is verbal and currently here he is making some small effort at speaking and does appear to recognize what is going on but is not following any commands.  He looks very dehydrated and is febrile here to 100.9 rectal.  His vitals are otherwise unremarkable   [MB]  1657 The patient is a very difficult IV stick and the nurses are having problems getting any blood work or access on him.  He did get a quick i-STAT which showed his potassium was elevated but the blood was pretty hemolyzed.  His EKG does not look like hyperkalemia.  They are page and the IV team to see if they can establish access.   [MB]  1905 reevaluated patient.  His temperature is down and he open his eyes to voice and he answered that he is feeling okay.  This is a lot more than prior.   [MB]  1906 Nurse has catheter in for urine and he likely will need to be admitted to the hospital for dehydration and possible he UTI complicating his metabolic encephalopathy.   [MB]  1906 In review of his prior labs under care everywhere his baseline creatinines last month were 1.2-1.5.  His creatinine here is 2.5 so likely he is dehydrated.   [MB]    Clinical Course User Index [MB] Hayden Rasmussen, MD     Final Clinical Impressions(s) / ED Diagnoses   Final diagnoses:  Fever, unspecified fever cause  Altered mental status, unspecified altered mental status type  Urinary tract infection in male  AKI (acute kidney injury) [facility])    ED Discharge Orders  None       Hayden Rasmussen, MD 08/13/17 1054

## 2017-08-12 NOTE — ED Notes (Signed)
Wife at bedside updated on plan of care. Wife heading home for the night. Number in chart if plan of care changes.

## 2017-08-12 NOTE — ED Triage Notes (Signed)
Per EMS, pt from [facility]. Per staff, pt is having AMS. LSN by staff was last night at some time possibly around shift change. Baseline pt able to speak but now pt has not been speaking. Pt is not follow commands. Unsure by staff if this is an acute change or progressive change. Hx of stroke but no previous deficits aware of. Pt has been at facility to try to gain his strength from previous stroke. Pt is a full code.   EMS VS BP 154/74, HR 92, 98% room air, 99.38F. CBG 228.

## 2017-08-12 NOTE — ED Notes (Signed)
This RN and phlebotomy unsuccessful with blood draw, IV team consulted, MD notified.

## 2017-08-12 NOTE — H&P (Signed)
[facility] Admission History and Physical Service Pager: [phone]  Patient name: Micheal Fox Medical record number: [id number] Date of birth: Aug 10, 1940 Age: 77 y.o. Gender: male  Primary Care Provider: No primary care provider on file. Consultants: None  Code Status: Full   Chief Complaint: Altered mental status   Assessment and Plan: Micheal Fox is a 77 y.o. male with a past medical history significant for hypertension, CAD status post cardiac stent, type 2 diabetes, CKD-III, peripheral artery disease, ureteral stone, prostate cancer, sciatica, gout who presents today with altered mental status in the setting of urinary tract infection.    #Altered mental status, acute, worsening Patient presented from [facility]term facility where he has been receiving therapy after recent discharge from [facility] for Micheal Fox on 5/29.  Wife reports that patient was at his baseline mental status until yesterday when she noticed that he was less communicative.  Today nursing staff was unable to arouse patient.  On admission to the ED patient was febrile 100.9 F, blood pressure of 143/71 with no tachypnea or tachycardia.  Lactic acid was 1.16.  Patient did not meet sepsis criteria.  UA showed large leukocytes and no nitrites with some hemoglobin consistent with UTI.  Patient has history of ureteral stent and was recently seen by urology at [facility] with plan of changing stent in August.  On CT abdomen pelvis, there was left-sided hydronephrosis mild to moderate and minimal hydronephrosis noted also on the right side. Based on initial presentation, lab results and imaging findings altered mental status likely secondary to UTI in the setting of possible pyelonephritis/obstruction.  Patient has a history of Pseudomonas pyelonephritis with bacteremia back in February 2019.  Etiology could also be secondary to possible prostatitis given patient history of prostate cancer.  Checks x-ray was negative for  any acute findings.  CT head was also negative for any acute neurologic abnormalities. --Admit to FMTS, admitting physician Dr. Nori Riis --Admit to MedSurg, with cardiac monitoring and continuous pulse ox --Ceftriaxone 1 g IV every 24 --Follow-up on urine culture --Follow-up on blood cultures --Follow-up on a.m. CBC and BMP --Consider consulting urology given hydronephrosis noted on imaging --Acetaminophen 650 mg every 6 as needed  #AKI  On admission patient's creatinine is 2.45.  On recent admission at [facility] creatinine was 2.40. Baselin appear to be around 1.7-1.8 based on  [facility] record.  Patient has CKD 3 likely secondary to uncontrolled hypertension as well as type 2 diabetes.  Patient also has a ureteral stricture with stent placement back in April 2019.  Patient had brief catheter and was treated with Augmentin by urology after stent placement.  CT abdomen pelvis showed left mild/moderate hydronephrosis with ureteral stent in place and small right hydronephrosis.  Hydronephrosis could also be contributing to AKI. --IV fluid 100 cc/h --Follow-up on a.m. temp  #Hypertension On admission patient blood pressure was 143/71.  Patient is on lisinopril, amlodipine, metoprolol.  Acute AKI, will hold lisinopril.  Could consider resuming amlodipine and metoprolol when patient able to take --Hold lisinopril --Restart amlodipine and metoprolol when appropriate --PRN IV blood pressure medication while n.p.o.  #Hyperlipidemia Patient is currently on Pravachol 40 mg.  We will continue home regimen.  Last lipid panel showed HDL 36, triglyceride 108, LDL 113, total cholesterol 170  #CAD status post stenting 2004 Patient has history of stent for which she is on Plavix and aspirin.  Plavix was recently held in the setting of recent subarachnoid hemorrhage while at [facility].  Patient told to resume  Plavix on 612 after discussing with PCP.  Patient was maintained on aspirin therapy.  It appears that patient has not resume  Plavix while at the SNF.  Troponin has been negative.  Recent stress test May 2019 at [facility] showed normal perfusion studies there is no evidence of ischemia and EF of 65%.  #Type 2 diabetes Most recent A1c 7.2.  Given patient age at goal.  Do not see any glycemic control medication and patient medication list.  We will continue to monitor.  Currently n.p.o. will not start sliding scale.  #PAD Arterial duplex on 4/8 2019 show evidence of arterial obstruction involving the SFA and or popliteal artery and tibioperoneal vessels.  #Sciatica, chronic  Patient has been taking gabapentin 300 mg 3 times daily as well as tramadol for pain control.  Patient will also on Flexeril.  Continue to monitor symptoms.  #Prostate cancer s/p radical prostatectomy in 2007 Followed by urology at [facility]. --Continue tamsulosin 0.4 mg daily  #Gout --Continue allopurinol   #Tobacco use Two cigarettes daily  FEN/GI: N.p.o. Prophylaxis: SCDs  Disposition: Return to SNF pending clinical improvement  History of Present Illness:  Micheal Fox is a 77 y.o. male with a past medical history significant for hypertension, CAD status post cardiac stent, type 2 diabetes, CKD-III, peripheral artery disease, ureteral stone, prostate cancer, sciatica, gout presenting with altered mental status.  Unable to obtain story from patient who is too altered to answer questions.  Story was obtained to patient's wife.  Present patient was at his baseline mentation until yesterday when she noted the patient was less communicative.  Today patient was unarousable by nursing home staff which led to his admission.  Patient has been up [place] since discharge from [facility] on 5/29 for rehab.  Patient was admitted to [facility] recently 5/16 and found to have low volume cortical subarachnoid hemorrhage as well as a small volume intraventricular hemorrhage.  Patient reports that since hemorrhage patient has developed weakness as well as neurologic deficit.   Wife reports that patient was recently seen by urologist at [facility] for evaluation of ureteral stent with plan to switch to a new stent  in August. On arrival to the ED patient had blood pressure of 143/71 and was febrile to 100.9 F.  There is no tachypnea or tachycardia  noted.  CT head was negative for any acute findings.  Chest x-ray was also unremarkable.  UA showed large leukocyte esterase.  CT abdomen pelvis showed Hydronephrosis and hydroureter on the left with ureteral stent in place and minimal hydroureter on the right.  Patient received ceftriaxone and Tylenol for fever.  Patient also bolused and blood and urine cultures were collected prior to starting antibiotics.  Review Of Systems: Per HPI with the following additions:   Review of Systems  Unable to perform ROS: Patient nonverbal    Patient Active Problem List   Diagnosis Date Noted  . AKI (acute kidney injury) ([place])   . Altered mental status   . Urinary tract infection in male     Past Medical History: Past Medical History:  Diagnosis Date  . CAD (coronary artery disease)   . Cancer [facility])    prostate  . COPD (chronic obstructive pulmonary disease) ([place])   . Diabetes mellitus without complication ([place])   . Gout   . Hyperlipidemia   . Myocardial infarction [facility])     Past Surgical History: Past Surgical History:  Procedure Laterality Date  . PROSTATE SURGERY     prostatectomy  about 11 years ago    Social History: Social History   Tobacco Use  . Smoking status: Current Every Day Smoker    Packs/day: 0.25    Years: 30.00    Pack years: 7.50    Types: Cigarettes  . Tobacco comment: weaning down on his own  Substance Use Topics  . Alcohol use: Not on file  . Drug use: Not on file   Additional social history:  Please also refer to relevant sections of EMR.  Family History: No family history on file. (If not completed, MUST add something in)  Allergies and Medications: Allergies  Allergen Reactions  .  Atorvastatin Other (See Comments)    Severe leg cramps  . Other Other (See Comments)    Surgical tape, pulls skin off when tape is removed   No current facility-administered medications on file prior to encounter.    Current Outpatient Medications on File Prior to Encounter  Medication Sig Dispense Refill  . acetaminophen (TYLENOL) 325 MG tablet Take 650 mg by mouth every 6 (six) hours as needed for mild pain.    Marland Kitchen allopurinol (ZYLOPRIM) 300 MG tablet Take 300 mg by mouth daily.    Marland Kitchen amLODipine (NORVASC) 10 MG tablet Take 10 mg by mouth daily.    Marland Kitchen aspirin 81 MG chewable tablet Chew 81 mg by mouth daily.    . cyclobenzaprine (FLEXERIL) 5 MG tablet Take 5 mg by mouth every 8 (eight) hours as needed for muscle spasms.    Marland Kitchen docusate sodium (COLACE) 100 MG capsule Take 100 mg by mouth 2 (two) times daily.    Marland Kitchen gabapentin (NEURONTIN) 300 MG capsule Take 300 mg by mouth 3 (three) times daily.    Marland Kitchen lisinopril (PRINIVIL,ZESTRIL) 20 MG tablet Take 20 mg by mouth daily.    . metoprolol succinate (TOPROL-XL) 50 MG 24 hr tablet Take 50 mg by mouth daily. Take with or immediately following a meal.    . Multiple Vitamin (MULTIVITAMIN WITH MINERALS) TABS tablet Take 1 tablet by mouth daily.    . nitroGLYCERIN (NITROSTAT) 0.4 MG SL tablet Place 0.4 mg under the tongue every 5 (five) minutes as needed for chest pain.    . Nutritional Supplements (NUTRITIONAL SUPPLEMENT PO) Take 120 mLs by mouth 2 (two) times daily. MedPass 2.0    . senna-docusate (SENNA S) 8.6-50 MG tablet Take 1 tablet by mouth daily as needed (constipation).    . tamsulosin (FLOMAX) 0.4 MG CAPS capsule Take 0.4 mg by mouth at bedtime.    . traMADol (ULTRAM) 50 MG tablet Take 50 mg by mouth every 6 (six) hours as needed (pain).       Objective: BP (!) 163/74   Pulse 93   Temp 99.7 F (37.6 C) (Rectal)   Resp 17   SpO2 100%  Physical Exam:   General: elderly man, in no acute distress, not able to participate in exam Cardiac: RRR,  normal heart sounds, no murmurs. 2+ radial and PT pulses bilaterally Respiratory: CTAB, normal effort, No wheezes, rales or rhonchi Abdomen: soft, nontender, nondistended, no hepatic or splenomegaly, +BS Extremities: no edema or cyanosis. WWP. Skin: warm and dry, no rashes noted Neuro: Limited due to patient inability to answer question possibility exam given mental status change    Labs and Imaging: CBC BMET  Recent Labs  Lab 08/12/17 1741  WBC 9.6  HGB 11.8*  HCT 37.4*  PLT 314   Recent Labs  Lab 08/12/17 1741  NA 137  K 5.5*  CL 103  CO2 21*  BUN 62*  CREATININE 2.45*  GLUCOSE 173*  CALCIUM 10.1     Itrop; 0.04  Ct Abdomen Pelvis Wo Contrast  Result Date: 08/12/2017 CLINICAL DATA:  pyelonephritis EXAM: CT ABDOMEN AND PELVIS WITHOUT CONTRAST TECHNIQUE: Multidetector CT imaging of the abdomen and pelvis was performed following the standard protocol without IV contrast. COMPARISON:  CT 05/19/2015 FINDINGS: Lower chest: Lung bases are clear. Hepatobiliary: Central hepatic cysts. Gallbladder normal. No biliary duct dilatation. No IV contrast Pancreas: Pancreas is normal. No ductal dilatation. No pancreatic inflammation. Spleen: Normal spleen Adrenals/urinary tract: Adrenal glands normal. Ureteral stent within the LEFT renal pelvis extending to the bladder. Mild hydronephrosis and hydroureter on the LEFT. RIGHT ureter mildly dilated to lesser degree. Bladder normal Stomach/Bowel: Stomach, small bowel, appendix, and cecum are normal. The colon and rectosigmoid colon are normal. Vascular/Lymphatic: Abdominal aorta is normal caliber with atherosclerotic calcification. There is no retroperitoneal or periportal lymphadenopathy. No pelvic lymphadenopathy. Reproductive: Prostate poorly defined. There calcifications and vascular clips prostate bed Other: No free fluid. Musculoskeletal: No aggressive osseous lesion. IMPRESSION: 1. Hydronephrosis and hydroureter on the LEFT with ureteral stent  in place. 2. Minimal hydroureter on the RIGHT. 3. Bladder normal. 4. Postsurgical change in the prostate bed. Electronically Signed   By: Suzy Bouchard M.D.   On: 08/12/2017 22:09   Ct Head Wo Contrast  Result Date: 08/12/2017 CLINICAL DATA:  Altered level of consciousness. EXAM: CT HEAD WITHOUT CONTRAST TECHNIQUE: Contiguous axial images were obtained from the base of the skull through the vertex without intravenous contrast. COMPARISON:  None FINDINGS: Brain: No evidence of acute infarction, hemorrhage, hydrocephalus, extra-axial collection or mass lesion/mass effect. Focal area of low attenuation within the proximal cervical cord may represent a syrinx. There is mild diffuse low-attenuation within the subcortical and periventricular white matter compatible with chronic microvascular disease. Prominence of sulci and ventricles identified compatible with brain atrophy. Vascular: No hyperdense vessel or unexpected calcification. Skull: Normal. Negative for fracture or focal lesion. Sinuses/Orbits: No acute finding. Other: None IMPRESSION: 1. No acute intracranial abnormalities. 2. Chronic small vessel ischemic change and brain atrophy. Electronically Signed   By: Kerby Moors M.D.   On: 08/12/2017 17:09   Dg Chest Port 1 View  Result Date: 08/12/2017 CLINICAL DATA:  Fever.  Altered mental status. EXAM: PORTABLE CHEST 1 VIEW COMPARISON:  None FINDINGS: The heart size and mediastinal contours are within normal limits. Both lungs are clear. The visualized skeletal structures are unremarkable. IMPRESSION: No active disease. Electronically Signed   By: Kerby Moors M.D.   On: 08/12/2017 16:53     Marjie Skiff, MD 08/12/2017, 10:48 PM PGY-2, [place] Intern pager: [phone], text pages welcome

## 2017-08-13 ENCOUNTER — Encounter (HOSPITAL_COMMUNITY): Payer: Self-pay | Admitting: General Practice

## 2017-08-13 ENCOUNTER — Other Ambulatory Visit: Payer: Self-pay

## 2017-08-13 DIAGNOSIS — N10 Acute tubulo-interstitial nephritis: Secondary | ICD-10-CM | POA: Diagnosis present

## 2017-08-13 DIAGNOSIS — Z960 Presence of urogenital implants: Secondary | ICD-10-CM

## 2017-08-13 DIAGNOSIS — J189 Pneumonia, unspecified organism: Secondary | ICD-10-CM

## 2017-08-13 DIAGNOSIS — I609 Nontraumatic subarachnoid hemorrhage, unspecified: Secondary | ICD-10-CM

## 2017-08-13 DIAGNOSIS — N1330 Unspecified hydronephrosis: Secondary | ICD-10-CM

## 2017-08-13 LAB — CBC
HEMATOCRIT: 35.6 % — AB (ref 39.0–52.0)
HEMOGLOBIN: 11.3 g/dL — AB (ref 13.0–17.0)
MCH: 28.5 pg (ref 26.0–34.0)
MCHC: 31.7 g/dL (ref 30.0–36.0)
MCV: 89.9 fL (ref 78.0–100.0)
Platelets: 296 10*3/uL (ref 150–400)
RBC: 3.96 MIL/uL — ABNORMAL LOW (ref 4.22–5.81)
RDW: 14.7 % (ref 11.5–15.5)
WBC: 10.3 10*3/uL (ref 4.0–10.5)

## 2017-08-13 LAB — BASIC METABOLIC PANEL
ANION GAP: 11 (ref 5–15)
BUN: 55 mg/dL — AB (ref 6–20)
CALCIUM: 9.6 mg/dL (ref 8.9–10.3)
CO2: 21 mmol/L — AB (ref 22–32)
Chloride: 109 mmol/L (ref 101–111)
Creatinine, Ser: 2.06 mg/dL — ABNORMAL HIGH (ref 0.61–1.24)
GFR calc Af Amer: 34 mL/min — ABNORMAL LOW (ref >60)
GFR calc non Af Amer: 29 mL/min — ABNORMAL LOW (ref >60)
GLUCOSE: 157 mg/dL — AB (ref 65–99)
Potassium: 4.6 mmol/L (ref 3.5–5.1)
Sodium: 141 mmol/L (ref 135–145)

## 2017-08-13 LAB — PROTIME-INR
INR: 1.21
PROTHROMBIN TIME: 15.2 s (ref 11.4–15.2)

## 2017-08-13 LAB — MRSA PCR SCREENING: MRSA BY PCR: NEGATIVE

## 2017-08-13 LAB — APTT: aPTT: 33 seconds (ref 24–36)

## 2017-08-13 MED ORDER — METOPROLOL SUCCINATE ER 50 MG PO TB24
50.0000 mg | ORAL_TABLET | Freq: Every day | ORAL | Status: DC
  Administered 2017-08-13 – 2017-08-14 (×2): 50 mg via ORAL
  Filled 2017-08-13 (×2): qty 1

## 2017-08-13 MED ORDER — ASPIRIN EC 81 MG PO TBEC
81.0000 mg | DELAYED_RELEASE_TABLET | Freq: Every day | ORAL | Status: DC
  Administered 2017-08-13 – 2017-08-17 (×4): 81 mg via ORAL
  Filled 2017-08-13 (×5): qty 1

## 2017-08-13 MED ORDER — VANCOMYCIN HCL IN DEXTROSE 1-5 GM/200ML-% IV SOLN
1000.0000 mg | Freq: Every day | INTRAVENOUS | Status: DC
  Administered 2017-08-13: 1000 mg via INTRAVENOUS
  Filled 2017-08-13: qty 200

## 2017-08-13 MED ORDER — HYDRALAZINE HCL 20 MG/ML IJ SOLN: 10.0000 mg | Freq: Four times a day (QID) | INTRAMUSCULAR | Status: DC | PRN

## 2017-08-13 MED ORDER — SODIUM CHLORIDE 0.9 % IV SOLN
1.0000 g | INTRAVENOUS | Status: DC
  Administered 2017-08-13: 1 g via INTRAVENOUS
  Filled 2017-08-13: qty 1

## 2017-08-13 MED ORDER — LABETALOL HCL 5 MG/ML IV SOLN
20.0000 mg | INTRAVENOUS | Status: DC | PRN
  Administered 2017-08-14: 20 mg via INTRAVENOUS
  Filled 2017-08-13 (×2): qty 4

## 2017-08-13 MED ORDER — PIPERACILLIN-TAZOBACTAM 3.375 G IVPB 30 MIN
3.3750 g | Freq: Once | INTRAVENOUS | Status: DC
  Filled 2017-08-13: qty 50

## 2017-08-13 MED ORDER — PRAVASTATIN SODIUM 40 MG PO TABS
40.0000 mg | ORAL_TABLET | Freq: Every day | ORAL | Status: DC
  Administered 2017-08-14 – 2017-08-17 (×3): 40 mg via ORAL
  Filled 2017-08-13 (×3): qty 1

## 2017-08-13 MED ORDER — VANCOMYCIN HCL IN DEXTROSE 1-5 GM/200ML-% IV SOLN
1000.0000 mg | Freq: Every day | INTRAVENOUS | Status: DC
  Filled 2017-08-13: qty 200

## 2017-08-13 MED ORDER — PIPERACILLIN-TAZOBACTAM 3.375 G IVPB
3.3750 g | Freq: Three times a day (TID) | INTRAVENOUS | Status: DC
  Filled 2017-08-13: qty 50

## 2017-08-13 NOTE — Progress Notes (Signed)
Pharmacy Antibiotic Note  Micheal Fox is a 77 y.o. male admitted on 08/12/2017 with UTI and history of urinary stent.  Pharmacy has been consulted for vancomycin and Zosyn dosing.  Plan: Vancomycin 1000mg  IV every 24 hours.  Goal trough 15-20 mcg/mL. Zosyn 3.375g IV q8h (4 hour infusion). Monitor clinical progression and LOT  Weight: 145 lb 8.1 oz (66 kg)  Temp (24hrs), Avg:99.7 F (37.6 C), Min:98.4 F (36.9 C), Max:100.9 F (38.3 C)  Recent Labs  Lab 08/12/17 1656 08/12/17 1741 08/13/17 0500  WBC  --  9.6 10.3  CREATININE 2.50* 2.45* 2.06*  LATICACIDVEN 1.16  --   --     CrCl cannot be calculated (Unknown ideal weight.).    Allergies  Allergen Reactions  . Atorvastatin Other (See Comments)    Severe leg cramps  . Other Other (See Comments)    Surgical tape, pulls skin off when tape is removed    Thank you for allowing pharmacy to be a part of this patient's care.  Jodean Lima Lanny Donoso 08/13/2017 11:32 AM

## 2017-08-13 NOTE — Progress Notes (Signed)
Pharmacy Antibiotic Note  Arnaldo Heffron is a 77 y.o. male admitted on 08/12/2017 with UTI and history of urinary stent.  Pharmacy has been consulted for vancomycin and cefepime dosing.  Discussed with Dr. Shan Levans about coverage of antibiotics and renal function- Zosyn covers enterococcus faecalis, but not enterococcus faecium so until cultures come back vancomycin is a better option for broad coverage. Given risk of AKI with vancomycin + Zosyn, decision made to switch to cefepime for pseudomonas coverage.  Plan: Vancomycin 1g IV q24h Cefepime 1g IV q24h Monitor clinical progression, renal function, c/s, and LOT  Weight: 134 lb 14.7 oz (61.2 kg)  Temp (24hrs), Avg:99.2 F (37.3 C), Min:98.3 F (36.8 C), Max:100.9 F (38.3 C)  Recent Labs  Lab 08/12/17 1656 08/12/17 1741 08/13/17 0500  WBC  --  9.6 10.3  CREATININE 2.50* 2.45* 2.06*  LATICACIDVEN 1.16  --   --     CrCl cannot be calculated (Unknown ideal weight.).    Allergies  Allergen Reactions  . Atorvastatin Other (See Comments)    Severe leg cramps  . Other Other (See Comments)    Surgical tape, pulls skin off when tape is removed   Zosyn 6/17 x1 Ceftriaxone 6/17 x1 Vancomycin 6/17>> Cefepime 6/17>>  6/16 Bcx: ngtd 6/16 urine: >100K GNR   Thank you for allowing pharmacy to be a part of this patient's care.  Kamaya Keckler D. Seena Ritacco, PharmD, BCPS Clinical Pharmacist [phone] Please check AMION for all [place] numbers 08/13/2017 2:47 PM

## 2017-08-13 NOTE — Progress Notes (Addendum)
Family Medicine Teaching Service Daily Progress Note Intern Pager: [phone]  Patient name: Micheal Fox Medical record number: [id number] Date of birth: [birth year] Age: 77 y.o. Gender: male  Primary Care Provider: No primary care provider on file. Consultants: Urology Code Status: full  Pt Overview and Major Events to Date:  Admitted 6/16  Assessment and Plan:  Micheal Fox is a 77 y.o. male with a past medical history significant for hypertension, CAD status post cardiac stent, type 2 diabetes, CKD-III, peripheral artery disease, ureteral stone, prostate cancer, sciatica, gout who presents today with altered mental status in the setting of urinary tract infection.    #Altered mental status, acute, worsening Likely due to infection, although he does have deficits at baseline due to previous [place].   WBC 10.3 on 6/17.  Has been afebrile during admission since his first temperature of 100.9. --Will broaden antibiotics to Cefepime and Vancomycin to cover pseudomonas and enterococcus respectively, pharmacy to dose --Follow-up on urine culture --Follow-up on blood cultures --Consult urology given hydronephrosis noted on imaging --Acetaminophen 650 mg every 6 as needed - foley in place  #AKI on CKD3 On admission patient's creatinine is 2.45, is 2.06 on 6/17.  Baseline appears to be around 1.7-1.8 based on [facility] record.  Patient has CKD 3 likely secondary to uncontrolled hypertension as well as type 2 diabetes.  Patient also has a ureteral stricture with stent placement back in April 2019.  CT abdomen pelvis showed left mild/moderate hydronephrosis with ureteral stent in place and small right hydronephrosis.  Hydronephrosis could also be contributing to AKI. --IV fluid 100 cc/h - avoiding vanc+zosyn combo to protect kidneys  #Hypertension On admission patient blood pressure was 143/71, current BP 132/82 on 6/17.  Patient is on lisinopril, amlodipine, metoprolol.  Acute AKI, will hold lisinopril.   Could consider resuming amlodipine and metoprolol when patient able to take --Hold lisinopril --Restart amlodipine and metoprolol when appropriate --PRN IV blood pressure medication while NPO  #Hyperlipidemia Patient is currently on Pravachol 40 mg.  We will continue home regimen.  Last lipid panel showed HDL 36, triglyceride 108, LDL 113, total cholesterol 170  #CAD status post stenting 2004 Patient has history of stent for which she is on Plavix and aspirin.  Plavix was recently held in the setting of recent subarachnoid hemorrhage while at [facility].  Patient told to resume Plavix on 6/12 after discussing with PCP.  Patient was maintained on aspirin therapy.  It appears that patient has not resume Plavix while at the SNF.  Troponin has been negative.  Recent stress test May 2019 at [facility] showed normal perfusion studies there is no evidence of ischemia and EF of 65%.  #Type 2 diabetes Most recent A1c 7.2.  Likely will not need glycemic control while in hospital given age, A1c, and NPO status.  - monitor CBGs Q4H  #PAD Arterial duplex on 4/8 2019 show evidence of arterial obstruction involving the SFA and or popliteal artery and tibioperoneal vessels.  #Sciatica, chronic  Patient has been taking gabapentin 300 mg 3 times daily as well as tramadol for pain control.  Patient will also on Flexeril.  Continue to monitor symptoms.  #Prostate cancer s/p radical prostatectomy in 2007 Followed by urology at [facility]. --Continue tamsulosin 0.4 mg daily  #Gout --Continue allopurinol   #Tobacco use Two cigarettes daily  FEN/GI: NPO PPx: SCDs  Disposition: SNF  Subjective:  Patient could say his name and denied pain this morning, but he was minimally communicative.  Objective: Temp:  [98.4  F (36.9 C)-100.9 F (38.3 C)] 98.4 F (36.9 C) (06/17 0545) Pulse Rate:  [78-94] 78 (06/17 0545) Resp:  [16-25] 18 (06/17 0545) BP: (109-166)/(59-96) 132/82 (06/17 0545) SpO2:  [96 %-100 %] 98 %  (06/17 0545) Physical Exam: General: thin, elderly man lying in bed, can answer name but does not answer other orientation questions Cardiovascular: RRR, no MRG Respiratory: CTAB Abdomen: soft, nontender, nondistended Extremities: thin, no edema, warm  Laboratory: Recent Labs  Lab 08/12/17 1656 08/12/17 1741 08/13/17 0500  WBC  --  9.6 10.3  HGB 8.8* 11.8* 11.3*  HCT 26.0* 37.4* 35.6*  PLT  --  314 296   Recent Labs  Lab 08/12/17 1656 08/12/17 1741 08/13/17 0500  NA 138 137 141  K 6.8* 5.5* 4.6  CL 109 103 109  CO2  --  21* 21*  BUN 80* 62* 55*  CREATININE 2.50* 2.45* 2.06*  CALCIUM  --  10.1 9.6  PROT  --  8.9*  --   BILITOT  --  0.6  --   ALKPHOS  --  84  --   ALT  --  12*  --   AST  --  26  --   GLUCOSE 177* 173* 157*    Imaging/Diagnostic Tests: Ct Abdomen Pelvis Wo Contrast  Result Date: 08/12/2017 CLINICAL DATA:  pyelonephritis EXAM: CT ABDOMEN AND PELVIS WITHOUT CONTRAST TECHNIQUE: Multidetector CT imaging of the abdomen and pelvis was performed following the standard protocol without IV contrast. COMPARISON:  CT 05/19/2015 FINDINGS: Lower chest: Lung bases are clear. Hepatobiliary: Central hepatic cysts. Gallbladder normal. No biliary duct dilatation. No IV contrast Pancreas: Pancreas is normal. No ductal dilatation. No pancreatic inflammation. Spleen: Normal spleen Adrenals/urinary tract: Adrenal glands normal. Ureteral stent within the LEFT renal pelvis extending to the bladder. Mild hydronephrosis and hydroureter on the LEFT. RIGHT ureter mildly dilated to lesser degree. Bladder normal Stomach/Bowel: Stomach, small bowel, appendix, and cecum are normal. The colon and rectosigmoid colon are normal. Vascular/Lymphatic: Abdominal aorta is normal caliber with atherosclerotic calcification. There is no retroperitoneal or periportal lymphadenopathy. No pelvic lymphadenopathy. Reproductive: Prostate poorly defined. There calcifications and vascular clips prostate bed  Other: No free fluid. Musculoskeletal: No aggressive osseous lesion. IMPRESSION: 1. Hydronephrosis and hydroureter on the LEFT with ureteral stent in place. 2. Minimal hydroureter on the RIGHT. 3. Bladder normal. 4. Postsurgical change in the prostate bed. Electronically Signed   By: Suzy Bouchard M.D.   On: 08/12/2017 22:09   Ct Head Wo Contrast  Result Date: 08/12/2017 CLINICAL DATA:  Altered level of consciousness. EXAM: CT HEAD WITHOUT CONTRAST TECHNIQUE: Contiguous axial images were obtained from the base of the skull through the vertex without intravenous contrast. COMPARISON:  None FINDINGS: Brain: No evidence of acute infarction, hemorrhage, hydrocephalus, extra-axial collection or mass lesion/mass effect. Focal area of low attenuation within the proximal cervical cord may represent a syrinx. There is mild diffuse low-attenuation within the subcortical and periventricular white matter compatible with chronic microvascular disease. Prominence of sulci and ventricles identified compatible with brain atrophy. Vascular: No hyperdense vessel or unexpected calcification. Skull: Normal. Negative for fracture or focal lesion. Sinuses/Orbits: No acute finding. Other: None IMPRESSION: 1. No acute intracranial abnormalities. 2. Chronic small vessel ischemic change and brain atrophy. Electronically Signed   By: Kerby Moors M.D.   On: 08/12/2017 17:09   Dg Chest Port 1 View  Result Date: 08/12/2017 CLINICAL DATA:  Fever.  Altered mental status. EXAM: PORTABLE CHEST 1 VIEW COMPARISON:  None FINDINGS: The  heart size and mediastinal contours are within normal limits. Both lungs are clear. The visualized skeletal structures are unremarkable. IMPRESSION: No active disease. Electronically Signed   By: Kerby Moors M.D.   On: 08/12/2017 16:53     Anival Pasha, Alcario Drought, MD 08/13/2017, 9:56 AM PGY-1, [place] Intern pager: [phone], text pages welcome

## 2017-08-13 NOTE — Progress Notes (Signed)
Called patient's urologist Dr. Renee Rival office to ask about advice on management of ureteral stent while patient is in the hospital.  Left my pager number with the office and told that Dr. Alto Denver will call back.

## 2017-08-13 NOTE — ED Notes (Addendum)
Pt bed and diaper saturated with urine. Pt pulled diaper off. NT and this RN cleaned patient up, gown changed, linens changed, new diaper placed. Pt pulling at diaper. Condom cath applied with bedside bag attached. Pt more alert at this time. Answering some questions and following some commands. Pt remains confused at times.

## 2017-08-13 NOTE — Progress Notes (Signed)
Spoke with [facility] urologist.  Recommended treating UTI with culture specific antibiotics.  Discussed that no good data regarding changing out ureteral stent but that biofilm is likely.  Patient is due for a change in his stent next month.  She recommends that if we were to change his stent that it should be done mid treatment after patient has had some clinical improvement.  She does not feel strongly either way and is available by phone for additional recommendations pending clinical course.  Bufford Lope, DO PGY-2, [facility] 08/13/2017 7:19 PM

## 2017-08-14 LAB — CBC
HCT: 37.3 % — ABNORMAL LOW (ref 39.0–52.0)
HEMOGLOBIN: 11.9 g/dL — AB (ref 13.0–17.0)
MCH: 29 pg (ref 26.0–34.0)
MCHC: 31.9 g/dL (ref 30.0–36.0)
MCV: 91 fL (ref 78.0–100.0)
Platelets: 275 10*3/uL (ref 150–400)
RBC: 4.1 MIL/uL — AB (ref 4.22–5.81)
RDW: 14.7 % (ref 11.5–15.5)
WBC: 10.2 10*3/uL (ref 4.0–10.5)

## 2017-08-14 LAB — URINE CULTURE
Culture: >=100000 — AB
SPECIAL REQUESTS: NORMAL

## 2017-08-14 LAB — BASIC METABOLIC PANEL
ANION GAP: 14 (ref 5–15)
BUN: 48 mg/dL — ABNORMAL HIGH (ref 6–20)
CHLORIDE: 113 mmol/L — AB (ref 101–111)
CO2: 18 mmol/L — AB (ref 22–32)
Calcium: 9.9 mg/dL (ref 8.9–10.3)
Creatinine, Ser: 1.89 mg/dL — ABNORMAL HIGH (ref 0.61–1.24)
GFR calc Af Amer: 37 mL/min — ABNORMAL LOW (ref >60)
GFR calc non Af Amer: 32 mL/min — ABNORMAL LOW (ref >60)
GLUCOSE: 136 mg/dL — AB (ref 65–99)
POTASSIUM: 4.6 mmol/L (ref 3.5–5.1)
Sodium: 145 mmol/L (ref 135–145)

## 2017-08-14 MED ORDER — LABETALOL HCL 5 MG/ML IV SOLN
20.0000 mg | INTRAVENOUS | Status: DC | PRN
  Administered 2017-08-14 – 2017-08-19 (×16): 20 mg via INTRAVENOUS
  Filled 2017-08-14 (×16): qty 4

## 2017-08-14 MED ORDER — AMLODIPINE BESYLATE 10 MG PO TABS
10.0000 mg | ORAL_TABLET | Freq: Every day | ORAL | Status: DC
  Administered 2017-08-14 – 2017-08-17 (×3): 10 mg via ORAL
  Filled 2017-08-14 (×4): qty 1

## 2017-08-14 MED ORDER — CEPHALEXIN 500 MG PO CAPS: 500.0000 mg | ORAL_CAPSULE | Freq: Three times a day (TID) | ORAL | Status: DC

## 2017-08-14 MED ORDER — LISINOPRIL 10 MG PO TABS
10.0000 mg | ORAL_TABLET | Freq: Every day | ORAL | Status: DC
  Administered 2017-08-14: 10 mg via ORAL
  Filled 2017-08-14: qty 1

## 2017-08-14 MED ORDER — ENSURE ENLIVE PO LIQD
237.0000 mL | Freq: Two times a day (BID) | ORAL | Status: DC
  Administered 2017-08-14 – 2017-08-17 (×4): 237 mL via ORAL

## 2017-08-14 MED ORDER — CEPHALEXIN 500 MG PO CAPS
500.0000 mg | ORAL_CAPSULE | Freq: Two times a day (BID) | ORAL | Status: DC
  Administered 2017-08-14 (×2): 500 mg via ORAL
  Filled 2017-08-14 (×2): qty 1

## 2017-08-14 NOTE — Progress Notes (Signed)
Initial Nutrition Assessment  DOCUMENTATION CODES:   Non-severe (moderate) malnutrition in context of chronic illness  INTERVENTION:   Ensure Enlive po BID, each supplement provides 350 kcal and 20 grams of protein  Feeding assistance as needed  NUTRITION DIAGNOSIS:   Moderate Malnutrition related to chronic illness as evidenced by mild fat depletion, mild muscle depletion.  GOAL:   Patient will meet greater than or equal to 90% of their needs  MONITOR:   PO intake, Supplement acceptance, Labs, Weight trends  REASON FOR ASSESSMENT:   Malnutrition Screening Tool    ASSESSMENT:   77 yo male admitted with acute worsening AMS in setting of UTI, AKI on CKD III. Pt with hx of HTN, CAD, DM, CKD III, PAD, prostate cancer, COPD, subdural hematoma  Diet advanced to Dysphagia I post SLP eval today Pt alert but confused, whispering and difficult to understand No recorded po intake or previous weight encounters    Labs: Creatinine 1.89, BUN 48, potassium wdl Meds: reviewed  NUTRITION - FOCUSED PHYSICAL EXAM:    Most Recent Value  Orbital Region  Moderate depletion  Upper Arm Region  Moderate depletion  Thoracic and Lumbar Region  No depletion  Buccal Region  Mild depletion  Temple Region  Moderate depletion  Clavicle Bone Region  Mild depletion  Clavicle and Acromion Bone Region  Mild depletion  Scapular Bone Region  Moderate depletion  Dorsal Hand  Moderate depletion  Patellar Region  Moderate depletion  Anterior Thigh Region  Moderate depletion  Posterior Calf Region  Severe depletion  Edema (RD Assessment)  None       Diet Order:   Diet Order           DIET - DYS 1 Room service appropriate? Yes; Fluid consistency: Thin  Diet effective now          EDUCATION NEEDS:   Not appropriate for education at this time  Skin:  Skin Assessment: Reviewed RN Assessment  Last BM:  6/17  Height:   Ht Readings from Last 1 Encounters:  08/14/17 5\' 8"  (1.727 m)     Weight:   Wt Readings from Last 1 Encounters:  08/14/17 134 lb 14.7 oz (61.2 kg)    Ideal Body Weight:     BMI:  Body mass index is 20.51 kg/m.  Estimated Nutritional Needs:   Kcal:  1600-`1800 kcals   Protein:  70-80 g  Fluid:  >/= 1.6 L   Kerman Passey MS, RD, LDN, CNSC [phone] Pager  [phone] Weekend/On-Call Pager

## 2017-08-14 NOTE — Progress Notes (Addendum)
Family Medicine Teaching Service Daily Progress Note Intern Pager: [phone]  Patient name: Micheal Fox Medical record number: [id number] Date of birth: 07-19-1940 Age: 77 y.o. Gender: male  Primary Care Provider: Raelene Bott, MD Consultants: Urology Code Status: full  Pt Overview and Major Events to Date:  Admitted 6/16  Assessment and Plan:  Micheal Fox is a 77 y.o. male with a past medical history significant for hypertension, CAD status post cardiac stent, type 2 diabetes, CKD-III, peripheral artery disease, ureteral stone, prostate cancer, sciatica, gout who presents today with altered mental status in the setting of urinary tract infection.    #Altered mental status, acute, worsening Likely due to infection, although he does have deficits at baseline due to previous [place].   WBC 10.2 on 6/18, has been afebrile since first temperature of 100.9. --dysphasia one diet, will likely improve with better concentration according to SLP --Acetaminophen 650 mg every 6 as needed - foley in place  Pyelonephritis Systemic symptoms, UA, hydronephrosis support this diagnosis.  Has ureteral stent in L ureter which may have a biofilm on it, unsure if this is the source.  Urine culture positive for E coli.  Due to systemic symptoms and unknown source on 6/17, started on broad spectrum abx listed above; may be able to narrow today.  [facility] urologist who sees Mr. Schwandt advises that the stent does not necessarily need to be pulled.  Urine culture positive for E coli sensitive to cefazolin, so will transition to Keflex since patient has been afebrile for >24 hours. - start Keflex 500 mg BID for 14 day total course (end date 6/29)  #AKI on CKD3 On admission patient's creatinine is 2.45, is 1.89 on 6/18.  Baseline appears to be around 1.7-1.8 based on [facility] record.   --IV fluid 100 cc/h - restart lisinopril at 10 mg daily  #Hypertension Patient's blood pressure has been high during admission, last reading  194/91.  Patient is on lisinopril, amlodipine, metoprolol, but these medications are being held due to NPO status.  Acute AKI, so were holding lisinopril.   --restart lisinopril as above --Restarted amlodipine and metoprolol since cleared by speech --PRN IV Labetalol for SBP >160, DPB>100  #Hyperlipidemia Patient is currently on Pravachol 40 mg.  We will continue home regimen.  Last lipid panel showed HDL 36, triglyceride 108, LDL 113, total cholesterol 170  #CAD status post stenting 2004 Patient has history of stent for which she is on Plavix and aspirin.  Plavix was recently held in the setting of recent subarachnoid hemorrhage while at [facility].  Patient told to resume Plavix on 6/12 after discussing with PCP.  Patient was maintained on aspirin therapy.  It appears that patient has not resume Plavix while at the SNF.  Troponin has been negative.  Recent stress test May 2019 at [facility] showed normal perfusion studies there is no evidence of ischemia and EF of 65%.  #Type 2 diabetes Most recent A1c 7.2.  Likely will not need glycemic control while in hospital given age and A1c. - monitor CBGs Q4H  #PAD Arterial duplex on 4/8 2019 show evidence of arterial obstruction involving the SFA and or popliteal artery and tibioperoneal vessels.  #Sciatica, chronic  Patient has been taking gabapentin 300 mg 3 times daily as well as tramadol for pain control.  Patient will also on Flexeril.  Continue to monitor symptoms.  #Prostate cancer s/p radical prostatectomy in 2007 Followed by urology at [facility]. --Continue tamsulosin 0.4 mg daily  #Gout --Continue allopurinol   #  Tobacco use Two cigarettes daily  FEN/GI: dysphagia 1 diet PPx: SCDs  Disposition: SNF  Subjective:  Patient could say his name and denied pain.  He was able to nod appropriately to questions.  Objective: Temp:  [97.8 F (36.6 C)-99.2 F (37.3 C)] 97.8 F (36.6 C) (06/18 0534) Pulse Rate:  [90-106] 106 (06/18 0534) Resp:   [14-20] 14 (06/18 0534) BP: (172-204)/(79-91) 194/91 (06/18 0534) SpO2:  [100 %] 100 % (06/18 0534) Weight:  [134 lb 14.7 oz (61.2 kg)-145 lb 8.1 oz (66 kg)] 134 lb 14.7 oz (61.2 kg) (06/17 2057) Physical Exam: General: thin, elderly man lying in bed, more alert this morning, but oriented to self only Cardiovascular: RRR, no MRG Respiratory: CTAB Abdomen: soft, nontender, nondistended Extremities: thin, no edema, warm  Laboratory: Recent Labs  Lab 08/12/17 1741 08/13/17 0500 08/14/17 0502  WBC 9.6 10.3 10.2  HGB 11.8* 11.3* 11.9*  HCT 37.4* 35.6* 37.3*  PLT 314 296 275   Recent Labs  Lab 08/12/17 1656 08/12/17 1741 08/13/17 0500  NA 138 137 141  K 6.8* 5.5* 4.6  CL 109 103 109  CO2  --  21* 21*  BUN 80* 62* 55*  CREATININE 2.50* 2.45* 2.06*  CALCIUM  --  10.1 9.6  PROT  --  8.9*  --   BILITOT  --  0.6  --   ALKPHOS  --  84  --   ALT  --  12*  --   AST  --  26  --   GLUCOSE 177* 173* 157*    Imaging/Diagnostic Tests: Ct Abdomen Pelvis Wo Contrast  Result Date: 08/12/2017 CLINICAL DATA:  pyelonephritis EXAM: CT ABDOMEN AND PELVIS WITHOUT CONTRAST TECHNIQUE: Multidetector CT imaging of the abdomen and pelvis was performed following the standard protocol without IV contrast. COMPARISON:  CT 05/19/2015 FINDINGS: Lower chest: Lung bases are clear. Hepatobiliary: Central hepatic cysts. Gallbladder normal. No biliary duct dilatation. No IV contrast Pancreas: Pancreas is normal. No ductal dilatation. No pancreatic inflammation. Spleen: Normal spleen Adrenals/urinary tract: Adrenal glands normal. Ureteral stent within the LEFT renal pelvis extending to the bladder. Mild hydronephrosis and hydroureter on the LEFT. RIGHT ureter mildly dilated to lesser degree. Bladder normal Stomach/Bowel: Stomach, small bowel, appendix, and cecum are normal. The colon and rectosigmoid colon are normal. Vascular/Lymphatic: Abdominal aorta is normal caliber with atherosclerotic calcification. There is  no retroperitoneal or periportal lymphadenopathy. No pelvic lymphadenopathy. Reproductive: Prostate poorly defined. There calcifications and vascular clips prostate bed Other: No free fluid. Musculoskeletal: No aggressive osseous lesion. IMPRESSION: 1. Hydronephrosis and hydroureter on the LEFT with ureteral stent in place. 2. Minimal hydroureter on the RIGHT. 3. Bladder normal. 4. Postsurgical change in the prostate bed. Electronically Signed   By: Suzy Bouchard M.D.   On: 08/12/2017 22:09   Ct Head Wo Contrast  Result Date: 08/12/2017 CLINICAL DATA:  Altered level of consciousness. EXAM: CT HEAD WITHOUT CONTRAST TECHNIQUE: Contiguous axial images were obtained from the base of the skull through the vertex without intravenous contrast. COMPARISON:  None FINDINGS: Brain: No evidence of acute infarction, hemorrhage, hydrocephalus, extra-axial collection or mass lesion/mass effect. Focal area of low attenuation within the proximal cervical cord may represent a syrinx. There is mild diffuse low-attenuation within the subcortical and periventricular white matter compatible with chronic microvascular disease. Prominence of sulci and ventricles identified compatible with brain atrophy. Vascular: No hyperdense vessel or unexpected calcification. Skull: Normal. Negative for fracture or focal lesion. Sinuses/Orbits: No acute finding. Other: None IMPRESSION: 1. No  acute intracranial abnormalities. 2. Chronic small vessel ischemic change and brain atrophy. Electronically Signed   By: Kerby Moors M.D.   On: 08/12/2017 17:09   Dg Chest Port 1 View  Result Date: 08/12/2017 CLINICAL DATA:  Fever.  Altered mental status. EXAM: PORTABLE CHEST 1 VIEW COMPARISON:  None FINDINGS: The heart size and mediastinal contours are within normal limits. Both lungs are clear. The visualized skeletal structures are unremarkable. IMPRESSION: No active disease. Electronically Signed   By: Kerby Moors M.D.   On: 08/12/2017 16:53      Lizbet Cirrincione, Alcario Drought, MD 08/14/2017, 6:33 AM PGY-1, [place] Intern pager: [phone], text pages welcome

## 2017-08-14 NOTE — Evaluation (Signed)
Clinical/Bedside Swallow Evaluation Patient Details  Name: Micheal Fox MRN: [id number] Date of Birth: 1941-02-01  Today's Date: 08/14/2017 Time: SLP Start Time (ACUTE ONLY): 0840 SLP Stop Time (ACUTE ONLY): 0900 SLP Time Calculation (min) (ACUTE ONLY): 20 min  Past Medical History:  Past Medical History:  Diagnosis Date  . CAD (coronary artery disease)   . CKD (chronic kidney disease), stage III ([place])    [facility] care everywhere notes (08/13/2017)  . COPD (chronic obstructive pulmonary disease) ([place])    /spouse "not aware of this hx" (08/13/2017)  . Foot drop, left foot   . Gout   . Headache    "weekly since 06/2015" (08/13/2017)  . Hyperlipidemia   . Hypertension   . Myocardial infarction ([place])    /spouse "not aware of this hx" (08/13/2017)  . Prostate cancer ([place]) 2007   S/P radiation and OR  . Sciatic nerve pain, left   . Subdural hematoma ([place]) 06/12/2017   "no OR; hospitalized for 14 days" (08/13/2017)  . Type II diabetes mellitus ([place])    /care everywhere notes from [facility] (08/13/2017);  "tx'd 06/2017 because when he had head trauma, it affected his blood sugar, not tx'd since" (08/13/2017)   Past Surgical History:  Past Surgical History:  Procedure Laterality Date  . CATARACT EXTRACTION W/ INTRAOCULAR LENS  IMPLANT, BILATERAL Bilateral 2017  . CORONARY ANGIOPLASTY WITH STENT PLACEMENT    . CYSTOSCOPY WITH FULGERATION  multiple   /care everywhere notes [facility] (08/13/2017)  . PROSTATECTOMY  2007  . urinary stent  05/2017   [facility]; "has it changed q 3-6 months; last one was placed 05/2017; due to be changed 09/2017 (08/13/2017)   HPI:  Micheal Fox is a 77 y.o. male with a past medical history significant for hypertension, CAD status post cardiac stent, type 2 diabetes, CKD-III, peripheral artery disease, ureteral stone, prostate cancer, sciatica, gout who presents today with altered mental status in the setting of urinary tract infection.      Assessment / Plan / Recommendation Clinical Impression    Pt presents with a cognitively based dysphagia due to poor sustained attention and difficulty following commands which, in combination with xerostomia supposedly from NPO status, results in impaired mastication and prolonged oral transit of solids.  Furthermore, due to prolonged oral phase and dry mouth pt also has mild-moderate oral residue post swallow and needs max cues for use of liquid wash to clear.  Pt has no overt s/s of aspiration with thin liquids either via cup sips or straw and oral phase is WFL with purees.  As a result, recommend initiating a diet of dys 1 textures and thin liquids.  Meds can be crushed or whole in puree depending on pt's ability to tolerate either.  Prognosis for advancement is good pending treatment of underlying infection and improved mentation.   SLP Visit Diagnosis: Dysphagia, unspecified (R13.10)    Aspiration Risk  Mild aspiration risk    Diet Recommendation Dysphagia 1 (Puree);Thin liquid   Liquid Administration via: Cup;Straw Medication Administration: Crushed with puree Supervision: Full supervision/cueing for compensatory strategies Compensations: Minimize environmental distractions;Small sips/bites;Follow solids with liquid Postural Changes: Seated upright at 90 degrees    Other  Recommendations Oral Care Recommendations: Oral care BID   Follow up Recommendations Other (comment)(To be determined )      Frequency and Duration min 2x/week          Prognosis Prognosis for Safe Diet Advancement: Good Barriers to Reach  Goals: Cognitive deficits      Swallow Study   General Date of Onset: (see HPI) HPI: Micheal Fox is a 77 y.o. male with a past medical history significant for hypertension, CAD status post cardiac stent, type 2 diabetes, CKD-III, peripheral artery disease, ureteral stone, prostate cancer, sciatica, gout who presents today with altered mental status in the setting of  urinary tract infection.   Type of Study: Bedside Swallow Evaluation Previous Swallow Assessment: none on record Diet Prior to this Study: NPO Temperature Spikes Noted: No Respiratory Status: Room air History of Recent Intubation: No Behavior/Cognition: Alert;Confused;Requires cueing;Distractible Oral Cavity Assessment: Dry Oral Care Completed by SLP: Yes Oral Cavity - Dentition: Adequate natural dentition Vision: Functional for self-feeding Self-Feeding Abilities: Able to feed self;Needs assist Patient Positioning: Upright in bed Baseline Vocal Quality: Low vocal intensity Volitional Cough: Strong Volitional Swallow: Unable to elicit    Oral/Motor/Sensory Function Overall Oral Motor/Sensory Function: Within functional limits   Ice Chips     Thin Liquid Thin Liquid: Within functional limits    Nectar Thick     Honey Thick     Puree Puree: Within functional limits   Solid   GO   Solid: Impaired Presentation: Self Fed Oral Phase Functional Implications: Prolonged oral transit;Impaired mastication;Oral residue        Micheal Fox L 08/14/2017,9:12 AM

## 2017-08-15 ENCOUNTER — Inpatient Hospital Stay (HOSPITAL_COMMUNITY): Payer: Medicare Other

## 2017-08-15 DIAGNOSIS — E440 Moderate protein-calorie malnutrition: Secondary | ICD-10-CM

## 2017-08-15 DIAGNOSIS — E43 Unspecified severe protein-calorie malnutrition: Secondary | ICD-10-CM | POA: Diagnosis present

## 2017-08-15 DIAGNOSIS — N10 Acute tubulo-interstitial nephritis: Secondary | ICD-10-CM

## 2017-08-15 DIAGNOSIS — Z960 Presence of urogenital implants: Secondary | ICD-10-CM

## 2017-08-15 DIAGNOSIS — I629 Nontraumatic intracranial hemorrhage, unspecified: Secondary | ICD-10-CM

## 2017-08-15 DIAGNOSIS — R4182 Altered mental status, unspecified: Secondary | ICD-10-CM

## 2017-08-15 DIAGNOSIS — N179 Acute kidney failure, unspecified: Secondary | ICD-10-CM

## 2017-08-15 LAB — LACTIC ACID, PLASMA: Lactic Acid, Venous: 1.5 mmol/L (ref 0.5–1.9)

## 2017-08-15 LAB — BASIC METABOLIC PANEL
Anion gap: 13 (ref 5–15)
BUN: 53 mg/dL — AB (ref 6–20)
CHLORIDE: 118 mmol/L — AB (ref 101–111)
CO2: 19 mmol/L — AB (ref 22–32)
CREATININE: 1.89 mg/dL — AB (ref 0.61–1.24)
Calcium: 10.1 mg/dL (ref 8.9–10.3)
GFR calc Af Amer: 37 mL/min — ABNORMAL LOW (ref >60)
GFR calc non Af Amer: 32 mL/min — ABNORMAL LOW (ref >60)
Glucose, Bld: 242 mg/dL — ABNORMAL HIGH (ref 65–99)
POTASSIUM: 5 mmol/L (ref 3.5–5.1)
Sodium: 150 mmol/L — ABNORMAL HIGH (ref 135–145)

## 2017-08-15 LAB — CBC
HEMATOCRIT: 35.9 % — AB (ref 39.0–52.0)
Hemoglobin: 11.5 g/dL — ABNORMAL LOW (ref 13.0–17.0)
MCH: 28.8 pg (ref 26.0–34.0)
MCHC: 32 g/dL (ref 30.0–36.0)
MCV: 90 fL (ref 78.0–100.0)
PLATELETS: 299 10*3/uL (ref 150–400)
RBC: 3.99 MIL/uL — ABNORMAL LOW (ref 4.22–5.81)
RDW: 14.7 % (ref 11.5–15.5)
WBC: 11.2 10*3/uL — ABNORMAL HIGH (ref 4.0–10.5)

## 2017-08-15 MED ORDER — METOPROLOL TARTRATE 5 MG/5ML IV SOLN
5.0000 mg | Freq: Once | INTRAVENOUS | Status: AC
  Administered 2017-08-15: 5 mg via INTRAVENOUS
  Filled 2017-08-15: qty 5

## 2017-08-15 MED ORDER — LISINOPRIL 40 MG PO TABS
40.0000 mg | ORAL_TABLET | Freq: Every day | ORAL | Status: DC
  Administered 2017-08-15: 40 mg via ORAL
  Filled 2017-08-15: qty 1

## 2017-08-15 MED ORDER — CARVEDILOL 12.5 MG PO TABS
12.5000 mg | ORAL_TABLET | Freq: Two times a day (BID) | ORAL | Status: DC
  Administered 2017-08-15 – 2017-08-17 (×4): 12.5 mg via ORAL
  Filled 2017-08-15 (×5): qty 1

## 2017-08-15 MED ORDER — HYDRALAZINE HCL 20 MG/ML IJ SOLN
10.0000 mg | INTRAMUSCULAR | Status: DC | PRN
  Administered 2017-08-15 – 2017-08-18 (×6): 10 mg via INTRAVENOUS
  Filled 2017-08-15 (×6): qty 1

## 2017-08-15 MED ORDER — SODIUM CHLORIDE 0.9 % IV BOLUS
1000.0000 mL | Freq: Once | INTRAVENOUS | Status: AC
  Administered 2017-08-15: 1000 mL via INTRAVENOUS

## 2017-08-15 MED ORDER — SODIUM CHLORIDE 0.9 % IV SOLN
1.0000 g | INTRAVENOUS | Status: DC
  Administered 2017-08-15: 1 g via INTRAVENOUS
  Filled 2017-08-15 (×2): qty 10

## 2017-08-15 NOTE — Progress Notes (Signed)
  Speech Language Pathology Treatment: Dysphagia  Patient Details Name: Micheal Fox MRN: [id number] DOB: May 20, 1940 Today's Date: 08/15/2017 Time: 1324-4010 SLP Time Calculation (min) (ACUTE ONLY): 38 min  Assessment / Plan / Recommendation Clinical Impression  SLP follow up to determine tolerance of po, education or indication for instrumental swallow eval.  Per review of hard medical chart - pt was on a regular/thin diet at [facility] - per note 07/25/17.  Today pt alert but not following directions and demonstrates very delayed verbal responses with very weak phonatory strength.  Oral holding apparent across all po intake but no indications of aspiration.  Possible delayed pharyngeal swallow with weak phonation cough increase pt's aspiration pna risk.  SLP can not rule out SILENT aspiration given neuro hx.  Modified diet to mitigate aspiration risk - Recommend to consider MBS to allow instrumental evaluation to determine least restrictive diet.  Advised NT to provide pt po only when accepting and is swallowing.  Left oral suction catheter in pt's bed for use by staff.  SLP to follow up next date-  MD please order MBS if you agree.  Thanks.   HPI HPI: Micheal Fox is a 77 y.o. male with a past medical history significant for recent CVA, hypertension, CAD status post cardiac stent, type 2 diabetes, CKD-III, peripheral artery disease, ureteral stone, prostate cancer, sciatica, gout who presents today with altered mental status in the setting of urinary tract infection.        SLP Plan  Continue with current plan of care;MBS(consider MBS given neuro hx and spike in temperature and WBC)       Recommendations  Diet recommendations: Dysphagia 1 (puree);Nectar-thick liquid Liquids provided via: Cup;Teaspoon Medication Administration: Crushed with puree Supervision: Full supervision/cueing for compensatory strategies Compensations: Minimize environmental distractions;Small sips/bites;Follow solids with  liquid(oral suction if pt not swallowing, delayed swallow present) Postural Changes and/or Swallow Maneuvers: Seated upright 90 degrees;Upright 30-60 min after meal                Oral Care Recommendations: Oral care QID Follow up Recommendations: Other (comment)(To be determined ) SLP Visit Diagnosis: Dysphagia, unspecified (R13.10) Plan: Continue with current plan of care;MBS(consider MBS given neuro hx and spike in temperature and WBC)       GO                Micheal Fox 08/15/2017, 10:03 AM  Luanna Salk, [facility] SLP [phone]

## 2017-08-15 NOTE — Progress Notes (Signed)
Order for MBS received, thank you.  Will plan for am 08/16/17.  Luanna Salk, [facility] SLP [phone]

## 2017-08-15 NOTE — Progress Notes (Addendum)
Family Medicine Teaching Service Daily Progress Note Intern Pager: [phone]  Patient name: Micheal Fox Medical record number: [id number] Date of birth: 13-Sep-1940 Age: 77 y.o. Gender: male  Primary Care Provider: Raelene Bott, MD Consultants: Urology Code Status: full  Pt Overview and Major Events to Date:  Admitted 6/16 Started cefepime and vancomycin; stopped vancomycin 6/17 Switched abx to Keflex 6/18 Escalated abx to ceftriaxone 6/19  Assessment and Plan:  Micheal Fox is a 77 y.o. male with a past medical history significant for hypertension, CAD status post cardiac stent, type 2 diabetes, CKD-III, peripheral artery disease, ureteral stone, prostate cancer, sciatica, gout who presents today with altered mental status in the setting of urinary tract infection.    #Altered mental status, acute, worsening Likely due to infection, although he does have deficits at baseline due to previous [place].  Concerned that worsening AMS could be due to inadequate antibiotic coverage with recent switch to Keflex. --dysphasia one diet, will likely improve with better concentration according to SLP --Acetaminophen 650 mg every 6 as needed - foley in place - obtain CXR to investigate possible pulmonary source of infection - update patient's wife - modified barium swallow study per SLP request  Pyelonephritis Fever overnight to 100.5 with slightly higher WBC of 11.2 and worsening AMS suggestive of worsening infection.  Also tachycardic.  Will escalate antibiotic to ceftriaxone. Urologist at [facility] says stent can be switched out.  - stop Keflex - start ceftriaxone 1 g daily - 1 L NS bolus - consult Urology to discuss removal of stent in setting of worsening infection  #AKI on CKD3 On admission patient's creatinine is 2.45, is 1.89 on 6/19.  Baseline appears to be around 1.7-1.8 based on [facility] record.   --IV fluid 100 cc/h - increase lisinopril to 40 mg daily  #Hypertension Patient's blood  pressure has been high during admission, last reading 187/83.  Patient is on lisinopril, amlodipine, metoprolol.   - increase lisinopril to 40 mg today - start Coreg 12.5 mg BID, stop Toprol --continue amlodipine --PRN IV Labetalol for SBP >160, DPB>100 - IV hydralazine PRN for SBP>160, DPB>100 if labetalol ineffective  #Hyperlipidemia Patient is currently on Pravachol 40 mg.  We will continue home regimen.  Last lipid panel showed HDL 36, triglyceride 108, LDL 113, total cholesterol 170  #CAD status post stenting 2004 Patient has history of stent for which he is on Plavix and aspirin.  Plavix was recently held in the setting of recent subarachnoid hemorrhage while at [facility].  Patient told to resume Plavix on 6/12 after discussing with PCP.  Patient was maintained on aspirin therapy.  It appears that patient has not resume Plavix while at the SNF.  Troponin has been negative.  Recent stress test May 2019 at [facility] showed normal perfusion studies there is no evidence of ischemia and EF of 65%.  #Type 2 diabetes Most recent A1c 7.2.  Likely will not need glycemic control while in hospital given age and A1c. - monitor CBGs Q4H  #PAD Arterial duplex on 4/8 2019 show evidence of arterial obstruction involving the SFA and or popliteal artery and tibioperoneal vessels.  #Sciatica, chronic  Patient has been taking gabapentin 300 mg 3 times daily as well as tramadol for pain control.  Patient will also on Flexeril.  Continue to monitor symptoms.  #Prostate cancer s/p radical prostatectomy in 2007 Followed by urology at [facility] --Continue tamsulosin 0.4 mg daily  #Gout --Continue allopurinol   #Tobacco use Two cigarettes daily  FEN/GI: dysphagia 1  diet PPx: SCDs  Disposition: SNF  Subjective:  Patient could not answer any questions today.  Objective: Temp:  [97.3 F (36.3 C)-100.5 F (38.1 C)] 99.9 F (37.7 C) (06/19 0452) Pulse Rate:  [101-110] 101 (06/19 0452) Resp:  [20-28] 20  (06/19 0452) BP: (167-216)/(83-104) 187/83 (06/19 0452) SpO2:  [100 %] 100 % (06/19 0452) Weight:  [134 lb 14.7 oz (61.2 kg)] 134 lb 14.7 oz (61.2 kg) (06/18 0915) Physical Exam: General: thin, elderly man lying in bed, lips and hands quivering, appeared to try to answer some questions but was unable to Cardiovascular: RRR, no MRG Respiratory: CTAB Abdomen: soft, nontender, nondistended Extremities: thin, no edema, warm  Laboratory: Recent Labs  Lab 08/12/17 1741 08/13/17 0500 08/14/17 0502  WBC 9.6 10.3 10.2  HGB 11.8* 11.3* 11.9*  HCT 37.4* 35.6* 37.3*  PLT 314 296 275   Recent Labs  Lab 08/12/17 1741 08/13/17 0500 08/14/17 0502  NA 137 141 145  K 5.5* 4.6 4.6  CL 103 109 113*  CO2 21* 21* 18*  BUN 62* 55* 48*  CREATININE 2.45* 2.06* 1.89*  CALCIUM 10.1 9.6 9.9  PROT 8.9*  --   --   BILITOT 0.6  --   --   ALKPHOS 84  --   --   ALT 12*  --   --   AST 26  --   --   GLUCOSE 173* 157* 136*    Imaging/Diagnostic Tests: Ct Abdomen Pelvis Wo Contrast  Result Date: 08/12/2017 CLINICAL DATA:  pyelonephritis EXAM: CT ABDOMEN AND PELVIS WITHOUT CONTRAST TECHNIQUE: Multidetector CT imaging of the abdomen and pelvis was performed following the standard protocol without IV contrast. COMPARISON:  CT 05/19/2015 FINDINGS: Lower chest: Lung bases are clear. Hepatobiliary: Central hepatic cysts. Gallbladder normal. No biliary duct dilatation. No IV contrast Pancreas: Pancreas is normal. No ductal dilatation. No pancreatic inflammation. Spleen: Normal spleen Adrenals/urinary tract: Adrenal glands normal. Ureteral stent within the LEFT renal pelvis extending to the bladder. Mild hydronephrosis and hydroureter on the LEFT. RIGHT ureter mildly dilated to lesser degree. Bladder normal Stomach/Bowel: Stomach, small bowel, appendix, and cecum are normal. The colon and rectosigmoid colon are normal. Vascular/Lymphatic: Abdominal aorta is normal caliber with atherosclerotic calcification. There is  no retroperitoneal or periportal lymphadenopathy. No pelvic lymphadenopathy. Reproductive: Prostate poorly defined. There calcifications and vascular clips prostate bed Other: No free fluid. Musculoskeletal: No aggressive osseous lesion. IMPRESSION: 1. Hydronephrosis and hydroureter on the LEFT with ureteral stent in place. 2. Minimal hydroureter on the RIGHT. 3. Bladder normal. 4. Postsurgical change in the prostate bed. Electronically Signed   By: Suzy Bouchard M.D.   On: 08/12/2017 22:09   Ct Head Wo Contrast  Result Date: 08/12/2017 CLINICAL DATA:  Altered level of consciousness. EXAM: CT HEAD WITHOUT CONTRAST TECHNIQUE: Contiguous axial images were obtained from the base of the skull through the vertex without intravenous contrast. COMPARISON:  None FINDINGS: Brain: No evidence of acute infarction, hemorrhage, hydrocephalus, extra-axial collection or mass lesion/mass effect. Focal area of low attenuation within the proximal cervical cord may represent a syrinx. There is mild diffuse low-attenuation within the subcortical and periventricular white matter compatible with chronic microvascular disease. Prominence of sulci and ventricles identified compatible with brain atrophy. Vascular: No hyperdense vessel or unexpected calcification. Skull: Normal. Negative for fracture or focal lesion. Sinuses/Orbits: No acute finding. Other: None IMPRESSION: 1. No acute intracranial abnormalities. 2. Chronic small vessel ischemic change and brain atrophy. Electronically Signed   By: Queen Slough.D.  On: 08/12/2017 17:09   Dg Chest Port 1 View  Result Date: 08/12/2017 CLINICAL DATA:  Fever.  Altered mental status. EXAM: PORTABLE CHEST 1 VIEW COMPARISON:  None FINDINGS: The heart size and mediastinal contours are within normal limits. Both lungs are clear. The visualized skeletal structures are unremarkable. IMPRESSION: No active disease. Electronically Signed   By: Kerby Moors M.D.   On: 08/12/2017 16:53      Winfrey, Alcario Drought, MD 08/15/2017, 7:07 AM PGY-1, [place] Intern pager: [phone], text pages welcome I have interviewed and examined the patient.  I have discussed the case and verified the key findings with Dr. Shan Levans.   I agree with their assessments and plans as documented in their pprogress note.

## 2017-08-16 ENCOUNTER — Inpatient Hospital Stay (HOSPITAL_COMMUNITY): Payer: Medicare Other

## 2017-08-16 ENCOUNTER — Other Ambulatory Visit: Payer: Self-pay | Admitting: Urology

## 2017-08-16 ENCOUNTER — Inpatient Hospital Stay (HOSPITAL_COMMUNITY): Payer: Medicare Other | Admitting: Certified Registered Nurse Anesthetist

## 2017-08-16 ENCOUNTER — Encounter (HOSPITAL_COMMUNITY): Payer: Self-pay | Admitting: *Deleted

## 2017-08-16 ENCOUNTER — Encounter (HOSPITAL_COMMUNITY)
Admission: EM | Disposition: A | Discharge status: Hospice | Payer: Self-pay | Source: Home / Self Care | Attending: Family Medicine

## 2017-08-16 DIAGNOSIS — R509 Fever, unspecified: Secondary | ICD-10-CM | POA: Insufficient documentation

## 2017-08-16 HISTORY — PX: CYSTOSCOPY W/ URETERAL STENT PLACEMENT: SHX1429

## 2017-08-16 LAB — BASIC METABOLIC PANEL
Anion gap: 13 (ref 5–15)
Anion gap: 12 (ref 5–15)
BUN: 50 mg/dL — ABNORMAL HIGH (ref 6–20)
BUN: 52 mg/dL — AB (ref 6–20)
CHLORIDE: 121 mmol/L — AB (ref 101–111)
CHLORIDE: 123 mmol/L — AB (ref 101–111)
CO2: 19 mmol/L — AB (ref 22–32)
CO2: 17 mmol/L — AB (ref 22–32)
CREATININE: 2 mg/dL — AB (ref 0.61–1.24)
CREATININE: 1.92 mg/dL — AB (ref 0.61–1.24)
Calcium: 10.2 mg/dL (ref 8.9–10.3)
Calcium: 9.9 mg/dL (ref 8.9–10.3)
GFR calc Af Amer: 35 mL/min — ABNORMAL LOW (ref >60)
GFR calc non Af Amer: 30 mL/min — ABNORMAL LOW (ref >60)
GFR calc non Af Amer: 32 mL/min — ABNORMAL LOW (ref >60)
GFR, EST AFRICAN AMERICAN: 37 mL/min — AB (ref >60)
GLUCOSE: 209 mg/dL — AB (ref 65–99)
Glucose, Bld: 188 mg/dL — ABNORMAL HIGH (ref 65–99)
POTASSIUM: 4.5 mmol/L (ref 3.5–5.1)
Potassium: 4.9 mmol/L (ref 3.5–5.1)
SODIUM: 152 mmol/L — AB (ref 135–145)
Sodium: 153 mmol/L — ABNORMAL HIGH (ref 135–145)

## 2017-08-16 LAB — CBC
HCT: 39.6 % (ref 39.0–52.0)
HEMOGLOBIN: 12.4 g/dL — AB (ref 13.0–17.0)
MCH: 29 pg (ref 26.0–34.0)
MCHC: 31.3 g/dL (ref 30.0–36.0)
MCV: 92.5 fL (ref 78.0–100.0)
Platelets: 341 10*3/uL (ref 150–400)
RBC: 4.28 MIL/uL (ref 4.22–5.81)
RDW: 15.4 % (ref 11.5–15.5)
WBC: 12.6 10*3/uL — ABNORMAL HIGH (ref 4.0–10.5)

## 2017-08-16 LAB — GLUCOSE, CAPILLARY
GLUCOSE-CAPILLARY: 200 mg/dL — AB (ref 65–99)
Glucose-Capillary: 180 mg/dL — ABNORMAL HIGH (ref 65–99)

## 2017-08-16 LAB — SURGICAL PCR SCREEN
MRSA, PCR: NEGATIVE
STAPHYLOCOCCUS AUREUS: NEGATIVE

## 2017-08-16 SURGERY — CYSTOSCOPY, WITH RETROGRADE PYELOGRAM AND URETERAL STENT INSERTION
Anesthesia: General | Site: Bladder | Laterality: Bilateral

## 2017-08-16 MED ORDER — PHENYLEPHRINE HCL 10 MG/ML IJ SOLN
INTRAMUSCULAR | Status: DC | PRN
  Administered 2017-08-16: 50 ug/min via INTRAVENOUS

## 2017-08-16 MED ORDER — CIPROFLOXACIN IN D5W 400 MG/200ML IV SOLN
400.0000 mg | Freq: Once | INTRAVENOUS | Status: AC
  Administered 2017-08-16: 400 mg via INTRAVENOUS
  Filled 2017-08-16: qty 200

## 2017-08-16 MED ORDER — PROPOFOL 10 MG/ML IV BOLUS
INTRAVENOUS | Status: DC | PRN
  Administered 2017-08-16: 100 mg via INTRAVENOUS

## 2017-08-16 MED ORDER — PROPOFOL 10 MG/ML IV BOLUS
INTRAVENOUS | Status: AC
Start: 2017-08-16 — End: ?
  Filled 2017-08-16: qty 20

## 2017-08-16 MED ORDER — PHENYLEPHRINE 40 MCG/ML (10ML) SYRINGE FOR IV PUSH (FOR BLOOD PRESSURE SUPPORT)
PREFILLED_SYRINGE | INTRAVENOUS | Status: DC | PRN
  Administered 2017-08-16 (×3): 200 ug via INTRAVENOUS

## 2017-08-16 MED ORDER — IOPAMIDOL (ISOVUE-300) INJECTION 61%
INTRAVENOUS | Status: AC
  Filled 2017-08-16: qty 50

## 2017-08-16 MED ORDER — SODIUM CHLORIDE 0.45 % IV SOLN
INTRAVENOUS | Status: DC
  Administered 2017-08-16 – 2017-08-19 (×9): via INTRAVENOUS

## 2017-08-16 MED ORDER — FENTANYL CITRATE (PF) 250 MCG/5ML IJ SOLN
INTRAMUSCULAR | Status: AC
  Filled 2017-08-16: qty 5

## 2017-08-16 MED ORDER — LIDOCAINE 2% (20 MG/ML) 5 ML SYRINGE
INTRAMUSCULAR | Status: DC | PRN
Start: 2017-08-16 — End: 2017-08-16
  Administered 2017-08-16: 60 mg via INTRAVENOUS

## 2017-08-16 MED ORDER — STERILE WATER FOR IRRIGATION IR SOLN
Status: DC | PRN
  Administered 2017-08-16: 3000 mL via INTRAVESICAL

## 2017-08-16 MED ORDER — LACTATED RINGERS IV SOLN: INTRAVENOUS | Status: DC

## 2017-08-16 MED ORDER — ONDANSETRON HCL 4 MG/2ML IJ SOLN
INTRAMUSCULAR | Status: DC | PRN
  Administered 2017-08-16: 4 mg via INTRAVENOUS

## 2017-08-16 MED ORDER — IOPAMIDOL (ISOVUE-300) INJECTION 61%
INTRAVENOUS | Status: DC | PRN
  Administered 2017-08-16: 20 mL via URETHRAL

## 2017-08-16 MED ORDER — SODIUM CHLORIDE 0.9 % IV SOLN
INTRAVENOUS | Status: DC | PRN
  Administered 2017-08-16: 17:00:00 via INTRAVENOUS

## 2017-08-16 MED ORDER — FENTANYL CITRATE (PF) 100 MCG/2ML IJ SOLN
INTRAMUSCULAR | Status: DC | PRN
  Administered 2017-08-16: 25 ug via INTRAVENOUS
  Administered 2017-08-16: 50 ug via INTRAVENOUS
  Administered 2017-08-16: 25 ug via INTRAVENOUS
  Administered 2017-08-16: 50 ug via INTRAVENOUS

## 2017-08-16 MED ORDER — SODIUM CHLORIDE 0.9 % IV SOLN: INTRAVENOUS | Status: DC

## 2017-08-16 MED ORDER — SODIUM CHLORIDE 0.9 % IV SOLN
2.0000 g | INTRAVENOUS | Status: DC
  Administered 2017-08-16 – 2017-08-20 (×5): 2 g via INTRAVENOUS
  Filled 2017-08-16 (×6): qty 20

## 2017-08-16 MED ORDER — SODIUM CHLORIDE 0.9 % IV SOLN
INTRAVENOUS | Status: DC
  Administered 2017-08-16 – 2017-08-20 (×3): via INTRAVENOUS

## 2017-08-16 MED ORDER — LIDOCAINE HCL URETHRAL/MUCOSAL 2 % EX GEL
CUTANEOUS | Status: AC
  Filled 2017-08-16: qty 20

## 2017-08-16 SURGICAL SUPPLY — 23 items
BAG URINE DRAINAGE (UROLOGICAL SUPPLIES) ×3 IMPLANT
BAG URO CATCHER STRL LF (MISCELLANEOUS) ×3 IMPLANT
BENZOIN TINCTURE PRP APPL 2/3 (GAUZE/BANDAGES/DRESSINGS) IMPLANT
CATH FOLEY 2WAY SLVR  5CC 16FR (CATHETERS)
CATH FOLEY 2WAY SLVR 5CC 16FR (CATHETERS) IMPLANT
CATH URET 5FR 28IN OPEN ENDED (CATHETERS) ×3 IMPLANT
GLOVE BIO SURGEON STRL SZ7.5 (GLOVE) ×3 IMPLANT
GOWN STRL REUS W/ TWL LRG LVL3 (GOWN DISPOSABLE) ×1 IMPLANT
GOWN STRL REUS W/TWL LRG LVL3 (GOWN DISPOSABLE) ×2
GUIDEWIRE STR DUAL SENSOR (WIRE) ×3 IMPLANT
KIT TURNOVER KIT B (KITS) ×3 IMPLANT
MANIFOLD NEPTUNE WASTE (CANNULA) ×3 IMPLANT
NS IRRIG 1000ML POUR BTL (IV SOLUTION) ×6 IMPLANT
PACK CYSTO (CUSTOM PROCEDURE TRAY) ×3 IMPLANT
PAD ARMBOARD 7.5X6 YLW CONV (MISCELLANEOUS) ×6 IMPLANT
PLUG CATH AND CAP STER (CATHETERS) IMPLANT
STENT CONTOUR 6FRX26X.038 (STENTS) ×3 IMPLANT
STENT URET 6FRX24 CONTOUR (STENTS) IMPLANT
SYR CONTROL 10ML LL (SYRINGE) ×3 IMPLANT
SYRINGE TOOMEY DISP (SYRINGE) IMPLANT
UNDERPAD 30X30 (UNDERPADS AND DIAPERS) ×3 IMPLANT
WATER STERILE IRR 1000ML POUR (IV SOLUTION) ×3 IMPLANT
WIRE COONS/BENSON .038X145CM (WIRE) IMPLANT

## 2017-08-16 NOTE — Anesthesia Procedure Notes (Signed)
Procedure Name: LMA Insertion Date/Time: 08/16/2017 5:03 PM Performed by: Lance Coon, CRNA Pre-anesthesia Checklist: Patient identified, Emergency Drugs available, Suction available, Patient being monitored and Timeout performed Patient Re-evaluated:Patient Re-evaluated prior to induction Oxygen Delivery Method: Circle system utilized Preoxygenation: Pre-oxygenation with 100% oxygen Induction Type: IV induction LMA: LMA inserted LMA Size: 5.0 Number of attempts: 1 Placement Confirmation: positive ETCO2 and breath sounds checked- equal and bilateral Tube secured with: Tape Dental Injury: Teeth and Oropharynx as per pre-operative assessment

## 2017-08-16 NOTE — Progress Notes (Signed)
Family Medicine Teaching Service Daily Progress Note Intern Pager: [phone]  Patient name: Micheal Fox Medical record number: [id number] Date of birth: Feb 26, 1941 Age: 77 y.o. Gender: male  Primary Care Provider: Raelene Bott, MD Consultants: Urology Code Status: full  Pt Overview and Major Events to Date:  Admitted 6/16 Started cefepime and vancomycin; stopped vancomycin 6/17 Switched abx to Keflex 6/18 Escalated abx to ceftriaxone 6/19 Increased ceftriaxone dose 6/20  Assessment and Plan:  Micheal Fox is a 77 y.o. male with a past medical history significant for hypertension, CAD status post cardiac stent, type 2 diabetes, CKD-III, peripheral artery disease, ureteral stone, prostate cancer, sciatica, gout who presents today with altered mental status in the setting of urinary tract infection.    Altered mental status, acute, worsening Likely due to infection, although he does have deficits at baseline due to previous [place].  Concerned that worsening AMS could be due to inadequate antibiotic coverage with recent switch to Keflex. --dysphasia one diet, will likely improve with better concentration according to SLP --Acetaminophen 650 mg every 6 as needed - foley in place - modified barium swallow study per SLP request - consider head CT?  Pyelonephritis No fever overnight, but patient's AMS is not improved and WBC is 12.2, up from 11.2 on 6/19.  Also received Tylenol overnight.  Continues to be tachycardic, feels warm and appears diaphoretic in the room. - obtain EKG - increase ceftriaxone to 2 g daily - Urology consult, appreciate recommendations - consider repeat CT abdomen/pelvis  Hypernatremia, worsening Sodium 153 on 6/20, up from 150 on 6/19, 145 on 6/18 - 1/2NS @ 125 ml/hr - recheck BMP at 1500  AKI on CKD3 On admission patient's creatinine is 2.45, is 2.00 on 6/20.  Baseline appears to be around 1.7-1.8 based on [facility] record.   --1/2NS @ 125  ml/hr  Hypertension Patient's blood pressure has been high during admission, last reading 187/83.  Patient is on lisinopril, amlodipine, metoprolol.   - increase lisinopril to 40 mg today - start Coreg 12.5 mg BID, stop Toprol --continue amlodipine --PRN IV Labetalol for SBP >160, DPB>100 - IV hydralazine PRN for SBP>160, DPB>100 if labetalol ineffective  Hyperlipidemia Patient is currently on Pravachol 40 mg.  We will continue home regimen.  Last lipid panel showed HDL 36, triglyceride 108, LDL 113, total cholesterol 170  CAD status post stenting 2004 Patient has history of stent for which he is on Plavix and aspirin.  Plavix was recently held in the setting of recent subarachnoid hemorrhage while at [facility].  Patient told to resume Plavix on 6/12 after discussing with PCP.  Patient was maintained on aspirin therapy.  It appears that patient has not resume Plavix while at the SNF.  Troponin has been negative.  Recent stress test May 2019 at [facility] showed normal perfusion studies there is no evidence of ischemia and EF of 65%.  Type 2 diabetes Most recent A1c 7.2.  Likely will not need glycemic control while in hospital given age and A1c. - monitor CBGs Q4H  PAD Arterial duplex on 4/8 2019 show evidence of arterial obstruction involving the SFA and or popliteal artery and tibioperoneal vessels.  Sciatica, chronic  Patient has been taking gabapentin 300 mg 3 times daily as well as tramadol for pain control.  Patient will also on Flexeril.  Continue to monitor symptoms.  Prostate cancer s/p radical prostatectomy in 2007 Followed by urology at [facility] --Continue tamsulosin 0.4 mg daily  Gout --Continue allopurinol   Tobacco use Two cigarettes  daily  FEN/GI: dysphagia 1 diet PPx: SCDs  Disposition: SNF  Subjective:  Patient shook his head "no" when asked if he was   Objective: Temp:  [98 F (36.7 C)-100.3 F (37.9 C)] 98 F (36.7 C) (06/20 0554) Pulse Rate:  [66-125] 125  (06/20 0736) Resp:  [18-22] 18 (06/20 0554) BP: (147-218)/(74-98) 155/94 (06/20 0736) SpO2:  [86 %-100 %] 99 % (06/20 3710) Physical Exam: General: thin, elderly man lying in bed, lips and hands quivering, shakes head "no" but nonverbal, cannot follow commands, appears diaphoretic Cardiovascular: RRR, no MRG Respiratory: CTAB Abdomen: soft, nontender, nondistended Extremities: thin, no edema, warm  Laboratory: Recent Labs  Lab 08/14/17 0502 08/15/17 0901 08/16/17 0536  WBC 10.2 11.2* 12.6*  HGB 11.9* 11.5* 12.4*  HCT 37.3* 35.9* 39.6  PLT 275 299 341   Recent Labs  Lab 08/12/17 1741  08/14/17 0502 08/15/17 0901 08/16/17 0536  NA 137   < > 145 150* 153*  K 5.5*   < > 4.6 5.0 4.9  CL 103   < > 113* 118* 121*  CO2 21*   < > 18* 19* 19*  BUN 62*   < > 48* 53* 50*  CREATININE 2.45*   < > 1.89* 1.89* 2.00*  CALCIUM 10.1   < > 9.9 10.1 10.2  PROT 8.9*  --   --   --   --   BILITOT 0.6  --   --   --   --   ALKPHOS 84  --   --   --   --   ALT 12*  --   --   --   --   AST 26  --   --   --   --   GLUCOSE 173*   < > 136* 242* 209*   < > = values in this interval not displayed.    Imaging/Diagnostic Tests: Ct Abdomen Pelvis Wo Contrast  Result Date: 08/12/2017 CLINICAL DATA:  pyelonephritis EXAM: CT ABDOMEN AND PELVIS WITHOUT CONTRAST TECHNIQUE: Multidetector CT imaging of the abdomen and pelvis was performed following the standard protocol without IV contrast. COMPARISON:  CT 05/19/2015 FINDINGS: Lower chest: Lung bases are clear. Hepatobiliary: Central hepatic cysts. Gallbladder normal. No biliary duct dilatation. No IV contrast Pancreas: Pancreas is normal. No ductal dilatation. No pancreatic inflammation. Spleen: Normal spleen Adrenals/urinary tract: Adrenal glands normal. Ureteral stent within the LEFT renal pelvis extending to the bladder. Mild hydronephrosis and hydroureter on the LEFT. RIGHT ureter mildly dilated to lesser degree. Bladder normal Stomach/Bowel: Stomach, small  bowel, appendix, and cecum are normal. The colon and rectosigmoid colon are normal. Vascular/Lymphatic: Abdominal aorta is normal caliber with atherosclerotic calcification. There is no retroperitoneal or periportal lymphadenopathy. No pelvic lymphadenopathy. Reproductive: Prostate poorly defined. There calcifications and vascular clips prostate bed Other: No free fluid. Musculoskeletal: No aggressive osseous lesion. IMPRESSION: 1. Hydronephrosis and hydroureter on the LEFT with ureteral stent in place. 2. Minimal hydroureter on the RIGHT. 3. Bladder normal. 4. Postsurgical change in the prostate bed. Electronically Signed   By: Suzy Bouchard M.D.   On: 08/12/2017 22:09   Dg Chest 1 View  Result Date: 08/15/2017 CLINICAL DATA:  Altered mental status.  Acute kidney injury. EXAM: CHEST  1 VIEW COMPARISON:  Single-view of the chest 08/12/2017. FINDINGS: Lungs are clear. Heart size is normal. No pneumothorax or pleural effusion. No acute bony abnormality. IMPRESSION: No acute disease. Electronically Signed   By: Inge Rise M.D.   On: 08/15/2017 11:35  Ct Head Wo Contrast  Result Date: 08/12/2017 CLINICAL DATA:  Altered level of consciousness. EXAM: CT HEAD WITHOUT CONTRAST TECHNIQUE: Contiguous axial images were obtained from the base of the skull through the vertex without intravenous contrast. COMPARISON:  None FINDINGS: Brain: No evidence of acute infarction, hemorrhage, hydrocephalus, extra-axial collection or mass lesion/mass effect. Focal area of low attenuation within the proximal cervical cord may represent a syrinx. There is mild diffuse low-attenuation within the subcortical and periventricular white matter compatible with chronic microvascular disease. Prominence of sulci and ventricles identified compatible with brain atrophy. Vascular: No hyperdense vessel or unexpected calcification. Skull: Normal. Negative for fracture or focal lesion. Sinuses/Orbits: No acute finding. Other: None  IMPRESSION: 1. No acute intracranial abnormalities. 2. Chronic small vessel ischemic change and brain atrophy. Electronically Signed   By: Kerby Moors M.D.   On: 08/12/2017 17:09   Dg Chest Port 1 View  Result Date: 08/12/2017 CLINICAL DATA:  Fever.  Altered mental status. EXAM: PORTABLE CHEST 1 VIEW COMPARISON:  None FINDINGS: The heart size and mediastinal contours are within normal limits. Both lungs are clear. The visualized skeletal structures are unremarkable. IMPRESSION: No active disease. Electronically Signed   By: Kerby Moors M.D.   On: 08/12/2017 16:53     Ellianah Cordy, Alcario Drought, MD 08/16/2017, 7:58 AM PGY-1, [place] Intern pager: [phone], text pages welcome

## 2017-08-16 NOTE — Anesthesia Preprocedure Evaluation (Addendum)
Anesthesia Evaluation  Patient identified by MRN, date of birth, ID band Patient awake    Reviewed: Allergy & Precautions, H&P , NPO status , Patient's Chart, lab work & pertinent test results  Airway Mallampati: II  TM Distance: >3 FB Neck ROM: Full    Dental no notable dental hx. (+) Teeth Intact, Dental Advisory Given   Pulmonary COPD, former smoker,    Pulmonary exam normal breath sounds clear to auscultation       Cardiovascular hypertension, Pt. on medications and Pt. on home beta blockers + CAD, + Past MI and + Cardiac Stents   Rhythm:Regular Rate:Normal - Systolic murmurs    Neuro/Psych  Headaches, negative psych ROS   GI/Hepatic negative GI ROS, Neg liver ROS,   Endo/Other  diabetes  Renal/GU Renal InsufficiencyRenal disease  negative genitourinary   Musculoskeletal   Abdominal   Peds  Hematology negative hematology ROS (+)   Anesthesia Other Findings   Reproductive/Obstetrics negative OB ROS                            Anesthesia Physical Anesthesia Plan  ASA: III  Anesthesia Plan: General   Post-op Pain Management:    Induction: Intravenous  PONV Risk Score and Plan: 3 and Ondansetron, Dexamethasone and Treatment may vary due to age or medical condition  Airway Management Planned: LMA  Additional Equipment:   Intra-op Plan:   Post-operative Plan: Extubation in OR  Informed Consent: I have reviewed the patients History and Physical, chart, labs and discussed the procedure including the risks, benefits and alternatives for the proposed anesthesia with the patient or authorized representative who has indicated his/her understanding and acceptance.   Dental advisory given  Plan Discussed with: CRNA  Anesthesia Plan Comments:         Anesthesia Quick Evaluation

## 2017-08-16 NOTE — Progress Notes (Signed)
Patient status post cystoscopy, bilateral retrogrades, left ureteral stent exchange.  There was no right hydronephrosis.  Left system with a lot of purulent urine as well as the bladder.  Now max drained with left ureteral stent and Foley.  Patient's will reflux with a stent and therefore a Foley will help prevent the reflux.  Foley can be removed once patient has clinically improved/stabilized and condom cath can be uses. Discussed with patient's wife to be sure to follow-up with Dr. Alto Denver to plan next stent exchange.

## 2017-08-16 NOTE — Consult Note (Signed)
Urology Consult   Physician requesting consult: Maia Breslow  Reason for consult: Pyelonephritis  History of Present Illness: Micheal Fox is a 77 y.o. 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  Pt has deficits at baseline due to previous [place].  Urine culture shows >100k colonies E Coli with multiple drug sensitivities.  CT A/P 6/16 revealed hydroureteronephrosis on the left with ureteral stent in place, mild right hydroureter, and a normal bladder. Despite Abx therapy infection does not appear to be resolving.  Renal U/S 08/16/17 shows no renal abscesses or significant changes from recent CT.  WBC 12.2 (11.2 6/19), Cr 2 (1.8 6/19).  ABx treatment: started on Vanc and cefepime on 6/16; vanc stopped on 6/17; switched to keflex 6/18; switched to ceftriaxone on 6/19.    He is followed at [facility] for his prostate cancer and left ureteral obstruction.  He had a cysto with stent exchange there 06/15/17 and was scheduled for next exchange in Aug 2019.  He has a hx of UTIs and pyelonephritis. Pt is awake and appears alert, however, he is non verbal.  There is no family present and all information was obtained by chart review.   Past Medical History:  Diagnosis Date  . CAD (coronary artery disease)   . CKD (chronic kidney disease), stage III ([place])    [facility] care everywhere notes (08/13/2017)  . COPD (chronic obstructive pulmonary disease) ([place])    /spouse "not aware of this hx" (08/13/2017)  . Foot drop, left foot   . Gout   . Headache    "weekly since 06/2015" (08/13/2017)  . Hyperlipidemia   . Hypertension   . Myocardial infarction ([place])    /spouse "not aware of this hx" (08/13/2017)  . Prostate cancer (Micheal Fox) 2007   S/P radiation and OR  . Sciatic nerve pain, left   . Subdural hematoma  ([place]) 06/12/2017   "no OR; hospitalized for 14 days" (08/13/2017)  . Type II diabetes mellitus ([place])    /care everywhere notes from [facility] (08/13/2017);  "tx'd 06/2017 because when he had head trauma, it affected his blood sugar, not tx'd since" (08/13/2017)    Past Surgical History:  Procedure Laterality Date  . CATARACT EXTRACTION W/ INTRAOCULAR LENS  IMPLANT, BILATERAL Bilateral 2017  . CORONARY ANGIOPLASTY WITH STENT PLACEMENT    . CYSTOSCOPY WITH FULGERATION  multiple   /care everywhere notes [facility] (08/13/2017)  . PROSTATECTOMY  2007  . urinary stent  05/2017   [facility]; "has it changed q 3-6 months; last one was placed 05/2017; due to be changed 09/2017 (08/13/2017)    Current Hospital Medications:  Home Meds:  . Current Meds  Medication Sig  . acetaminophen (TYLENOL) 325 MG tablet Take 650 mg by mouth every 6 (six) hours as needed for mild pain.  Marland Kitchen allopurinol (ZYLOPRIM) 300 MG tablet Take 300 mg by mouth daily.  Marland Kitchen amLODipine (NORVASC) 10 MG tablet Take 10 mg by mouth daily.  Marland Kitchen aspirin 81 MG chewable tablet Chew 81 mg by mouth daily.  . cyclobenzaprine (FLEXERIL) 5 MG tablet Take 5 mg by mouth every 8 (eight) hours as needed for muscle spasms.  Marland Kitchen docusate sodium (COLACE) 100 MG capsule Take 100 mg by mouth 2 (two) times daily.  Marland Kitchen  gabapentin (NEURONTIN) 300 MG capsule Take 300 mg by mouth 3 (three) times daily.  Marland Kitchen lisinopril (PRINIVIL,ZESTRIL) 20 MG tablet Take 20 mg by mouth daily.  . metoprolol succinate (TOPROL-XL) 50 MG 24 hr tablet Take 50 mg by mouth daily. Take with or immediately following a meal.  . Multiple Vitamin (MULTIVITAMIN WITH MINERALS) TABS tablet Take 1 tablet by mouth daily.  . nitroGLYCERIN (NITROSTAT) 0.4 MG SL tablet Place 0.4 mg under the tongue every 5 (five) minutes as needed for chest pain.  . Nutritional Supplements (NUTRITIONAL SUPPLEMENT PO) Take 120 mLs by mouth 2 (two) times daily. MedPass 2.0  . senna-docusate (SENNA S)  8.6-50 MG tablet Take 1 tablet by mouth daily as needed (constipation).  . tamsulosin (FLOMAX) 0.4 MG CAPS capsule Take 0.4 mg by mouth at bedtime.  . traMADol (ULTRAM) 50 MG tablet Take 50 mg by mouth every 6 (six) hours as needed (pain).      Scheduled Meds: . amLODipine  10 mg Oral Daily  . aspirin EC  81 mg Oral Daily  . carvedilol  12.5 mg Oral BID WC  . feeding supplement (ENSURE ENLIVE)  237 mL Oral BID BM  . pravastatin  40 mg Oral q1800   Continuous Infusions: . sodium chloride Stopped (08/16/17 1006)  . sodium chloride    . cefTRIAXone (ROCEPHIN)  IV 200 mL/hr at 08/16/17 1035   PRN Meds:.sodium chloride, acetaminophen, hydrALAZINE, labetalol  Allergies:  Allergies  Allergen Reactions  . Atorvastatin Other (See Comments)    Severe leg cramps  . Other Other (See Comments)    Surgical tape, pulls skin off when tape is removed    History reviewed. No pertinent family history.  Social History:  reports that he quit smoking about 3 months ago. His smoking use included cigarettes. He has a 21.12 pack-year smoking history. He has never used smokeless tobacco. He reports that he drank alcohol. He reports that he does not use drugs.  ROS: A complete review of systems could not be performed due to pt being non verbal.   Physical Exam:  Vital signs in last 24 hours: Temp:  [98 F (36.7 C)-100.3 F (37.9 C)] 99.3 F (37.4 C) (06/20 0900) Pulse Rate:  [66-125] 123 (06/20 0900) Resp:  [18-22] 18 (06/20 0554) BP: (147-193)/(81-99) 156/99 (06/20 0900) SpO2:  [86 %-100 %] 100 % (06/20 0900) Constitutional:  Alert, No acute distress Cardiovascular: Regular rate and rhythm Respiratory: Normal respiratory effort GI: Abdomen is soft, nontender, nondistended, no abdominal masses GU: condom cath in place with clear/yellow urine in foley bag Lymphatic: No lymphadenopathy Neurologic: Grossly intact Psychiatric: unable to assess  Laboratory Data:  Recent Labs     08/14/17 0502 08/15/17 0901 08/16/17 0536  WBC 10.2 11.2* 12.6*  HGB 11.9* 11.5* 12.4*  HCT 37.3* 35.9* 39.6  PLT 275 299 341    Recent Labs    08/14/17 0502 08/15/17 0901 08/16/17 0536  NA 145 150* 153*  K 4.6 5.0 4.9  CL 113* 118* 121*  GLUCOSE 136* 242* 209*  BUN 48* 53* 50*  CALCIUM 9.9 10.1 10.2  CREATININE 1.89* 1.89* 2.00*     Results for orders placed or performed during the hospital encounter of 08/12/17 (from the past 24 hour(s))  Glucose, capillary     Status: Abnormal   Collection Time: 08/16/17  1:13 AM  Result Value Ref Range   Glucose-Capillary 180 (H) 65 - 99 mg/dL  CBC     Status: Abnormal   Collection Time:  08/16/17  5:36 AM  Result Value Ref Range   WBC 12.6 (H) 4.0 - 10.5 K/uL   RBC 4.28 4.22 - 5.81 MIL/uL   Hemoglobin 12.4 (L) 13.0 - 17.0 g/dL   HCT 39.6 39.0 - 52.0 %   MCV 92.5 78.0 - 100.0 fL   MCH 29.0 26.0 - 34.0 pg   MCHC 31.3 30.0 - 36.0 g/dL   RDW 15.4 11.5 - 15.5 %   Platelets 341 150 - 400 K/uL  Basic metabolic panel     Status: Abnormal   Collection Time: 08/16/17  5:36 AM  Result Value Ref Range   Sodium 153 (H) 135 - 145 mmol/L   Potassium 4.9 3.5 - 5.1 mmol/L   Chloride 121 (H) 101 - 111 mmol/L   CO2 19 (L) 22 - 32 mmol/L   Glucose, Bld 209 (H) 65 - 99 mg/dL   BUN 50 (H) 6 - 20 mg/dL   Creatinine, Ser 2.00 (H) 0.61 - 1.24 mg/dL   Calcium 10.2 8.9 - 10.3 mg/dL   GFR calc non Af Amer 30 (L) >60 mL/min   GFR calc Af Amer 35 (L) >60 mL/min   Anion gap 13 5 - 15   Recent Results (from the past 240 hour(s))  Urine culture     Status: Abnormal   Collection Time: 08/12/17  4:19 PM  Result Value Ref Range Status   Specimen Description URINE, CATHETERIZED  Final   Special Requests   Final    Normal Performed at [facility], [address], [place], [place] [address]    Culture >=100,000 COLONIES/mL ESCHERICHIA COLI (A)  Final   Report Status 08/14/2017 FINAL  Final   Organism ID, Bacteria ESCHERICHIA COLI (A)  Final       Susceptibility   Escherichia coli - MIC*    AMPICILLIN >=32 RESISTANT Resistant     CEFAZOLIN <=4 SENSITIVE Sensitive     CEFTRIAXONE <=1 SENSITIVE Sensitive     CIPROFLOXACIN <=0.25 SENSITIVE Sensitive     GENTAMICIN <=1 SENSITIVE Sensitive     IMIPENEM <=0.25 SENSITIVE Sensitive     NITROFURANTOIN <=16 SENSITIVE Sensitive     TRIMETH/SULFA >=320 RESISTANT Resistant     AMPICILLIN/SULBACTAM 16 INTERMEDIATE Intermediate     PIP/TAZO <=4 SENSITIVE Sensitive     Extended ESBL NEGATIVE Sensitive     * >=100,000 COLONIES/mL ESCHERICHIA COLI  Blood Culture (routine x 2)     Status: None (Preliminary result)   Collection Time: 08/12/17  5:24 PM  Result Value Ref Range Status   Specimen Description BLOOD LEFT WRIST  Final   Special Requests   Final    BOTTLES DRAWN AEROBIC AND ANAEROBIC Blood Culture results may not be optimal due to an inadequate volume of blood received in culture bottles Performed at [facility], [address], [place], [place] [address]    Culture NO GROWTH 3 DAYS  Final   Report Status PENDING  Incomplete  Blood Culture (routine x 2)     Status: None (Preliminary result)   Collection Time: 08/12/17  7:15 PM  Result Value Ref Range Status   Specimen Description BLOOD RIGHT HAND  Final   Special Requests   Final    BOTTLES DRAWN AEROBIC AND ANAEROBIC Blood Culture results may not be optimal due to an inadequate volume of blood received in culture bottles Performed at [facility], [place] [address], [place], [place] [address]    Culture NO GROWTH 3 DAYS  Final   Report Status PENDING  Incomplete  MRSA PCR Screening     Status: None   Collection Time: 08/13/17  2:04 PM  Result Value Ref Range Status   MRSA by PCR NEGATIVE NEGATIVE Final    Comment:        The GeneXpert MRSA Assay (FDA approved for NASAL specimens only), is one component of a comprehensive MRSA colonization surveillance program. It is not intended to diagnose MRSA infection nor to  guide or monitor treatment for MRSA infections. Performed at [facility], [place] [address] [place], [place] [address]     Renal Function: Recent Labs    08/12/17 1656 08/12/17 1741 08/13/17 0500 08/14/17 0502 08/15/17 0901 08/16/17 0536  CREATININE 2.50* 2.45* 2.06* 1.89* 1.89* 2.00*   Estimated Creatinine Clearance: 25.9 mL/min (A) (by C-G formula based on SCr of 2 mg/dL (H)).  Radiologic Imaging:  CLINICAL DATA:  pyelonephritis  EXAM: CT ABDOMEN AND PELVIS WITHOUT CONTRAST  TECHNIQUE: Multidetector CT imaging of the abdomen and pelvis was performed following the standard protocol without IV contrast.  COMPARISON:  CT 05/19/2015  FINDINGS: Lower chest: Lung bases are clear.  Hepatobiliary: Central hepatic cysts. Gallbladder normal. No biliary duct dilatation. No IV contrast  Pancreas: Pancreas is normal. No ductal dilatation. No pancreatic inflammation.  Spleen: Normal spleen  Adrenals/urinary tract: Adrenal glands normal.  Ureteral stent within the LEFT renal pelvis extending to the bladder. Mild hydronephrosis and hydroureter on the LEFT. RIGHT ureter mildly dilated to lesser degree. Bladder normal  Stomach/Bowel: Stomach, small bowel, appendix, and cecum are normal. The colon and rectosigmoid colon are normal.  Vascular/Lymphatic: Abdominal aorta is normal caliber with atherosclerotic calcification. There is no retroperitoneal or periportal lymphadenopathy. No pelvic lymphadenopathy.  Reproductive: Prostate poorly defined. There calcifications and vascular clips prostate bed  Other: No free fluid.  Musculoskeletal: No aggressive osseous lesion.  IMPRESSION: 1. Hydronephrosis and hydroureter on the LEFT with ureteral stent in place. 2. Minimal hydroureter on the RIGHT. 3. Bladder normal. 4. Postsurgical change in the prostate bed.   Electronically Signed   By: Suzy Bouchard M.D.   On: 08/12/2017 22:09   Dg Chest  1 View  Result Date: 08/15/2017 CLINICAL DATA:  Altered mental status.  Acute kidney injury. EXAM: CHEST  1 VIEW COMPARISON:  Single-view of the chest 08/12/2017. FINDINGS: Lungs are clear. Heart size is normal. No pneumothorax or pleural effusion. No acute bony abnormality. IMPRESSION: No acute disease. Electronically Signed   By: Inge Rise M.D.   On: 08/15/2017 11:35   US Renal  Result Date: 08/16/2017 CLINICAL DATA:  Hydronephrosis. EXAM: RENAL / URINARY TRACT ULTRASOUND COMPLETE COMPARISON:  CT abdomen pelvis dated August 12, 2017. FINDINGS: Right Kidney: Length: 9.3 cm. Echogenicity within normal limits. No mass or hydronephrosis visualized. Small simple cyst measuring 1.2 cm. Left Kidney: Length: 9.2 cm. Echogenicity within normal limits. Stent visualized in the renal pelvis. Mild-to-moderate hydronephrosis, not significantly changed. No mass visualized. Bladder: There is layering, heterogeneously hypoechoic material in the posterior bladder. There is no internal vascularity. The bilateral ureteral jets are visualized. IMPRESSION: 1. Unchanged mild-to-moderate left hydronephrosis. Partially visualized stent in the left renal pelvis. 2. Layering, heterogeneously hypoechoic material in the posterior bladder without internal vascularity. While nonspecific, this may represent hematoma. Consider direct visualization for further evaluation. Electronically Signed   By: Titus Dubin M.D.   On: 08/16/2017 12:08     Impression/Recommendation  UTI/Pyelonephritis--has not been responding to Abx therapy.  Imaging shows no evidence  of renal abscess.  Increase Ceftriaxone to 2g QD. Dr. Junious Silk to take the pt for cysto/bilateral retrogrades/left stent exchange/possible right stent placement later today.  Keep NPO.   Pt has not had recent issues with retention, however, in light of persistent infection and mild right hydro will check PVR to ensure pt is completely emptying his bladder.  He has condom  cath in place but may need indwelling foley.    Debbrah Alar 08/16/2017, 1:06 PM

## 2017-08-16 NOTE — Progress Notes (Signed)
SLP Cancellation Note  Patient Details Name: Micheal Fox MRN: [id number] DOB: May 29, 1940   Cancelled treatment:       Reason Eval/Treat Not Completed: Medical issues which prohibited therapy(note HR sustained elevated- pt currently lethargic, will defer MBS until pt able to participate)  Spoke to [employer] regarding pt.    Micheal Fox, [place] [facility] SLP [phone]

## 2017-08-16 NOTE — Progress Notes (Signed)
Inpatient Diabetes Program Recommendations  AACE/ADA: New Consensus Statement on Inpatient Glycemic Control (2015)  Target Ranges:  Prepandial:   less than 140 mg/dL      Peak postprandial:   less than 180 mg/dL (1-2 hours)      Critically ill patients:  140 - 180 mg/dL   Lab Results  Component Value Date   GLUCAP 180 (H) 08/16/2017    Review of Glycemic Control Results for Micheal Fox, Micheal Fox (MRN [id number]) as of 08/16/2017 11:32  Ref. Range 08/16/2017 01:13  Glucose-Capillary Latest Ref Range: 65 - 99 mg/dL 180 (H)   Diabetes history: Type 2 DM Outpatient Diabetes medications: none Current orders for Inpatient glycemic control: none  Inpatient Diabetes Program Recommendations:    AM FS was 209 mg/dL, may want to place orders for glycemic control order set for BS checks TID + HS and place correction for Novolog 0-9 units TID.   Thanks, Bronson Curb, MSN, RNC-OB Diabetes Coordinator [phone] (8a-5p)

## 2017-08-16 NOTE — Discharge Instructions (Signed)
Ureteral Stent Implantation, Care After Refer to this sheet in the next few weeks. These instructions provide you with information about caring for yourself after your procedure. Your health care provider may also give you more specific instructions. Your treatment has been planned according to current medical practices, but problems sometimes occur. Call your health care provider if you have any problems or questions after your procedure.  Stent removal/replacement: Be sure to follow-up with Dr. Alto Denver to plan stent exchange.   What can I expect after the procedure? After the procedure, it is common to have:  Nausea.  Mild pain when you urinate. You may feel this pain in your lower back or lower abdomen. Pain should stop within a few minutes after you urinate. This may last for up to 1 week.  A small amount of blood in your urine for several days.  Follow these instructions at home:  Medicines  Take over-the-counter and prescription medicines only as told by your health care provider.  If you were prescribed an antibiotic medicine, take it as told by your health care provider. Do not stop taking the antibiotic even if you start to feel better.  Do not drive for 24 hours if you received a sedative.  Do not drive or operate heavy machinery while taking prescription pain medicines. Activity  Return to your normal activities as told by your health care provider. Ask your health care provider what activities are safe for you.  Do not lift anything that is heavier than 10 lb (4.5 kg). Follow this limit for 1 week after your procedure, or for as long as told by your health care provider. General instructions  Watch for any blood in your urine. Call your health care provider if the amount of blood in your urine increases.  If you have a catheter: ? Follow instructions from your health care provider about taking care of your catheter and collection bag. ? Do not take baths, swim, or use  a hot tub until your health care provider approves.  Drink enough fluid to keep your urine clear or pale yellow.  Keep all follow-up visits as told by your health care provider. This is important. Contact a health care provider if:  You have pain that gets worse or does not get better with medicine, especially pain when you urinate.  You have difficulty urinating.  You feel nauseous or you vomit repeatedly during a period of more than 2 days after the procedure. Get help right away if:  Your urine is dark red or has blood clots in it.  You are leaking urine (have incontinence).  The end of the stent comes out of your urethra.  You cannot urinate.  You have sudden, sharp, or severe pain in your abdomen or lower back.  You have a fever. This information is not intended to replace advice given to you by your health care provider. Make sure you discuss any questions you have with your health care provider. Document Released: 10/16/2012 Document Revised: 07/22/2015 Document Reviewed: 08/28/2014 Elsevier Interactive Patient Education  [employer].

## 2017-08-16 NOTE — Transfer of Care (Signed)
Immediate Anesthesia Transfer of Care Note  Patient: Micheal Fox  Procedure(s) Performed: CYSTOSCOPY WITH BILATERAL  RETROGRADE PYELOGRAM LEFT URETERAL STENT EXCHANGE (Bilateral Bladder)  Patient Location: PACU  Anesthesia Type:General  Level of Consciousness: awake and patient cooperative  Airway & Oxygen Therapy: Patient Spontanous Breathing  Post-op Assessment: Report given to RN and Post -op Vital signs reviewed and stable  Post vital signs: Reviewed and stable  Last Vitals:  Vitals Value Taken Time  BP 156/74 08/16/2017  6:06 PM  Temp 37.4 C 08/16/2017  6:06 PM  Pulse 94 08/16/2017  6:08 PM  Resp 15 08/16/2017  6:09 PM  SpO2 100 % 08/16/2017  6:08 PM  Vitals shown include unvalidated device data.  Last Pain:  Vitals:   08/16/17 1035  TempSrc:   PainSc: Asleep         Complications: No apparent anesthesia complications

## 2017-08-16 NOTE — Op Note (Signed)
Preoperative diagnosis: Left hydronephrosis, left ureteral stricture, urinary tract infection and sepsis Postoperative diagnosis: Same  Procedure: Cystoscopy with bilateral retrograde pyelogram and left ureteral stent exchange  Surgeon: Micheal Fox  Anesthesia: General  Indication for procedure: Micheal Fox is a 77 year old African-American male with a history of prostatectomy and salvage radiation with a PSA recurrence.  He was found to have a left ureteral stricture and underwent ureteral stent placement which has been changed last April 2019.  He was admitted with UTI and sepsis-like picture and has not improved drastically.  He continues to have mental status changes, tachycardia.  CT scan showed stent on the left in good position with some mild hydronephrosis which can be seen with a stent.  Right mild hydronephrosis was called on the CT but I thought that side looked okay.  Ultrasound revealed no fluid collections around the kidneys today and left but no right hydro-.  He did not have a Foley catheter, he had a condom catheter.  Findings: On exam the penis was uncircumcised but no phimosis or lesion of the foreskin.  On cystoscopy the urethra was normal.  The prostate was surgically absent with a patent bladder neck.  The ureteral orifice ease were close to the bladder neck consistent with his prior surgery.  Bladder was trabeculated with a lot of erythema.  The bladder was filled with thick pus that required irrigation.  Once clear the right ureteral orifice was visualized but no reflux noted.  He was hypotensive.  He was covered with Cipro.  Left ureteral stent noted to be in good position.  Left retrograde pyelogram-this outlined the dilated ureter with a tortuous proximal ureter and dilation of the collecting system more than I would expect with a stent.  Right retrograde pyelogram-this outlined the delicate single ureter single collecting system unit without filling defect, stricture or dilation.   Once the 5 [place] open-ended catheter was withdrawn there was good efflux of contrast.   Description of procedure: After consent was obtained patient brought to the operating room.  After adequate anesthesia, the patient was placed in lithotomy position.  He was prepped and draped in the usual sterile fashion.  A timeout was performed to confirm the patient and procedure.  The cystoscope was passed per urethra and the bladder visualized.  It was full of a thick pus which required irrigation several times.  This drained thick dishwater-like fluid.  Once clear the left ureteral stent was visualized.  It had a cloudy biofilm on it.  Right ureteral orifice was visualized but no reflux noted.  I then passed the flexible graspers and removed the ureteral stent through the urethral meatus.  A sensor wire was advanced but this coiled in the mid ureter and would not advance.  Once the stent was removed a 5 [place] open-ended catheter was passed to the wire coil.  The wire was removed and a  purulent hydronephrotic drip was noted.  Retrograde injection of contrast revealed some tortuosity of the ureter.  An angled glide was then advanced and it coiled proximally.  The 5 [place] open-ended catheter was advanced to the coil and the wire removed and again hydronephrotic drip noted.  Contrast was injected and this was noted to be at the UPJ with another curve up into the renal pelvis and collecting system.  Therefore under fluoroscopic guidance the Glidewire was advanced up and into the upper pole collecting system and coiled.  A final time the 5 [place] open-ended was passed which straightened out the  ureter and up into the kidney and the wire removed.  Final retrograde confirmed proper placement in the collecting system with moderate hydronephrosis.  The wire was then coiled back in the upper calyx and the 5 [place] open-ended removed.  A 626 cm stent was advanced and the wire removed.  A good coil was seen in the upper calyx  and a good coil in the bladder.  The stent appeared to be draining a good amount of purulent drainage.  Attention was then turned to the right again because there was no reflux it was cannulated with the Glidewire and the 5 [place] open-ended passed just inside it.  The wire was removed and retrograde injection of contrast was performed.  The 5 [place] open-ended was removed and contrast effluxed well.  The bladder was then filled and the scope removed but the patient is incontinent.  To ensure max drainage, placed a 16 French Foley and seated the balloon at the bladder neck.  Shot some dilute contrast through it and on fluoroscopy imaging the balloon was noted to be in the bladder.  The catheter was left to gravity drainage and he was awakened and taken to the recovery room in stable condition.  Complications: None  Blood loss: Minimal  Specimens: None  Drains: left 6 x 26 cm ureteral stent, 16 French Foley catheter

## 2017-08-16 NOTE — Progress Notes (Signed)
MD on call made aware of patient's HR sustaining at 120's-125. Will continue to monitor.

## 2017-08-17 ENCOUNTER — Encounter (HOSPITAL_COMMUNITY): Payer: Self-pay | Admitting: Urology

## 2017-08-17 DIAGNOSIS — Z419 Encounter for procedure for purposes other than remedying health state, unspecified: Secondary | ICD-10-CM | POA: Insufficient documentation

## 2017-08-17 DIAGNOSIS — C61 Malignant neoplasm of prostate: Secondary | ICD-10-CM | POA: Insufficient documentation

## 2017-08-17 DIAGNOSIS — N131 Hydronephrosis with ureteral stricture, not elsewhere classified: Secondary | ICD-10-CM

## 2017-08-17 DIAGNOSIS — N1330 Unspecified hydronephrosis: Secondary | ICD-10-CM | POA: Diagnosis present

## 2017-08-17 LAB — BASIC METABOLIC PANEL
ANION GAP: 9 (ref 5–15)
BUN: 60 mg/dL — ABNORMAL HIGH (ref 6–20)
CO2: 14 mmol/L — AB (ref 22–32)
CREATININE: 2.09 mg/dL — AB (ref 0.61–1.24)
Calcium: 8.8 mg/dL — ABNORMAL LOW (ref 8.9–10.3)
Chloride: 126 mmol/L — ABNORMAL HIGH (ref 101–111)
GFR calc Af Amer: 33 mL/min — ABNORMAL LOW (ref >60)
GFR, EST NON AFRICAN AMERICAN: 28 mL/min — AB (ref >60)
Glucose, Bld: 176 mg/dL — ABNORMAL HIGH (ref 65–99)
Potassium: 4.9 mmol/L (ref 3.5–5.1)
SODIUM: 149 mmol/L — AB (ref 135–145)

## 2017-08-17 LAB — CULTURE, BLOOD (ROUTINE X 2)
Culture: NO GROWTH
Culture: NO GROWTH

## 2017-08-17 LAB — CBC
HCT: 32.8 % — ABNORMAL LOW (ref 39.0–52.0)
HEMOGLOBIN: 10.4 g/dL — AB (ref 13.0–17.0)
MCH: 28.8 pg (ref 26.0–34.0)
MCHC: 31.7 g/dL (ref 30.0–36.0)
MCV: 90.9 fL (ref 78.0–100.0)
PLATELETS: 249 10*3/uL (ref 150–400)
RBC: 3.61 MIL/uL — AB (ref 4.22–5.81)
RDW: 15.5 % (ref 11.5–15.5)
WBC: 14.4 10*3/uL — AB (ref 4.0–10.5)

## 2017-08-17 NOTE — Progress Notes (Addendum)
Family Medicine Teaching Service Daily Progress Note Intern Pager: [phone]  Patient name: Micheal Fox Medical record number: [id number] Date of birth: 1940/06/19 Age: 77 y.o. Gender: male  Primary Care Provider: Raelene Bott, MD Consultants: Urology Code Status: full  Pt Overview and Major Events to Date:  Admitted 6/16 Started cefepime and vancomycin; stopped vancomycin 6/17 Switched abx to Keflex 6/18 Escalated abx to ceftriaxone 6/19 Increased ceftriaxone dose 6/20 Ureteral stent exchanged on 6/20 AMS improved 6/21  Assessment and Plan:  Micheal Fox is a 77 y.o. male with a past medical history significant for hypertension, CAD status post cardiac stent, type 2 diabetes, CKD-III, peripheral artery disease, ureteral stone, prostate cancer, sciatica, gout who presents today with altered mental status in the setting of urinary tract infection.    Altered mental status, acute, improving Likely due to infection, although he does have deficits at baseline due to previous [place].  AMS improved on 6/21, hopefully d/t improving infection after stent was replaced on 6/20. --dysphasia one diet, will likely improve with better concentration according to SLP --Acetaminophen 650 mg every 6 as needed - foley in place - modified barium swallow study per SLP request  Pyelonephritis Although AMS is better today, Mr. Micheal Fox is febrile to 100.6 this morning with an increase in WBC to 14.4 on 6/21 from 12.2.  We expect his infection symptoms to linger due to the location of infection, although they should resolve in the next few days since his mental status is already resolving. - continue ceftriaxone 2 g daily  Hypernatremia, improving Sodium 149 on 6/21, down from 153 on 6/20 - continue 1/2NS @ 125 ml/hr  AKI on CKD3 On admission patient's creatinine is 2.45, is 2.09 on 6/21.  Baseline appears to be around 1.7-1.8 based on [facility] record.   --1/2NS @ 125 ml/hr  Hypertension Patient's blood  pressure has been high during admission, last reading 168/79.  Patient is on lisinopril, amlodipine, metoprolol at home.   - lisinopril stopped on 6/20 given patient's renal function - continue Coreg 12.5 mg BID, stop Toprol --continue amlodipine --PRN IV Labetalol for SBP >160, DPB>100 - IV hydralazine PRN for SBP>160, DPB>100 if labetalol ineffective  Hyperlipidemia Patient is currently on Pravachol 40 mg.  We will continue home regimen.  Last lipid panel showed HDL 36, triglyceride 108, LDL 113, total cholesterol 170  CAD status post stenting 2004 Patient has history of stent for which he is on Plavix and aspirin.  Plavix was recently held in the setting of recent subarachnoid hemorrhage while at [facility].  Patient told to resume Plavix on 6/12 after discussing with PCP.  Patient was maintained on aspirin therapy.  It appears that patient has not resume Plavix while at the SNF.  Troponin has been negative.  Recent stress test May 2019 at [facility] showed normal perfusion studies there is no evidence of ischemia and EF of 65%.  Type 2 diabetes Most recent A1c 7.2.  Likely will not need glycemic control while in hospital given age and A1c. - monitor CBGs Q4H  PAD Arterial duplex on 4/8 2019 show evidence of arterial obstruction involving the SFA and or popliteal artery and tibioperoneal vessels.  Sciatica, chronic  Patient has been taking gabapentin 300 mg 3 times daily as well as tramadol for pain control.  Patient will also on Flexeril.  Continue to monitor symptoms.  Prostate cancer s/p radical prostatectomy in 2007 Followed by urology at [facility]. --Continue tamsulosin 0.4 mg daily  Gout --Continue allopurinol   Tobacco  use Two cigarettes daily  FEN/GI: dysphagia 1 diet PPx: SCDs  Disposition: SNF  Subjective:  Patient denies pain today.  Objective: Temp:  [97.7 F (36.5 C)-100.6 F (38.1 C)] 100.6 F (38.1 C) (06/21 0907) Pulse Rate:  [95-126] 118 (06/21 0907) Resp:   [15-26] 26 (06/21 0907) BP: (148-168)/(74-112) 168/79 (06/21 0907) SpO2:  [98 %-99 %] 99 % (06/21 0907) Weight:  [134 lb (60.8 kg)] 134 lb (60.8 kg) (06/20 1449) Physical Exam: General: thin, elderly man lying in bed, waved goodbye and squeezed my hand on command.  Appears much more alert today.  Being fed breakfast this morning. Cardiovascular: tachycardic, regular rhythm Respiratory: CTAB Abdomen: soft, nontender, nondistended Extremities: thin, no edema, warm  Laboratory: Recent Labs  Lab 08/15/17 0901 08/16/17 0536 08/17/17 0704  WBC 11.2* 12.6* 14.4*  HGB 11.5* 12.4* 10.4*  HCT 35.9* 39.6 32.8*  PLT 299 341 249   Recent Labs  Lab 08/12/17 1741  08/16/17 0536 08/16/17 1421 08/17/17 0704  NA 137   < > 153* 152* 149*  K 5.5*   < > 4.9 4.5 4.9  CL 103   < > 121* 123* 126*  CO2 21*   < > 19* 17* 14*  BUN 62*   < > 50* 52* 60*  CREATININE 2.45*   < > 2.00* 1.92* 2.09*  CALCIUM 10.1   < > 10.2 9.9 8.8*  PROT 8.9*  --   --   --   --   BILITOT 0.6  --   --   --   --   ALKPHOS 84  --   --   --   --   ALT 12*  --   --   --   --   AST 26  --   --   --   --   GLUCOSE 173*   < > 209* 188* 176*   < > = values in this interval not displayed.    Imaging/Diagnostic Tests: Ct Abdomen Pelvis Wo Contrast  Result Date: 08/12/2017 CLINICAL DATA:  pyelonephritis EXAM: CT ABDOMEN AND PELVIS WITHOUT CONTRAST TECHNIQUE: Multidetector CT imaging of the abdomen and pelvis was performed following the standard protocol without IV contrast. COMPARISON:  CT 05/19/2015 FINDINGS: Lower chest: Lung bases are clear. Hepatobiliary: Central hepatic cysts. Gallbladder normal. No biliary duct dilatation. No IV contrast Pancreas: Pancreas is normal. No ductal dilatation. No pancreatic inflammation. Spleen: Normal spleen Adrenals/urinary tract: Adrenal glands normal. Ureteral stent within the LEFT renal pelvis extending to the bladder. Mild hydronephrosis and hydroureter on the LEFT. RIGHT ureter mildly  dilated to lesser degree. Bladder normal Stomach/Bowel: Stomach, small bowel, appendix, and cecum are normal. The colon and rectosigmoid colon are normal. Vascular/Lymphatic: Abdominal aorta is normal caliber with atherosclerotic calcification. There is no retroperitoneal or periportal lymphadenopathy. No pelvic lymphadenopathy. Reproductive: Prostate poorly defined. There calcifications and vascular clips prostate bed Other: No free fluid. Musculoskeletal: No aggressive osseous lesion. IMPRESSION: 1. Hydronephrosis and hydroureter on the LEFT with ureteral stent in place. 2. Minimal hydroureter on the RIGHT. 3. Bladder normal. 4. Postsurgical change in the prostate bed. Electronically Signed   By: Suzy Bouchard M.D.   On: 08/12/2017 22:09   Dg Chest 1 View  Result Date: 08/15/2017 CLINICAL DATA:  Altered mental status.  Acute kidney injury. EXAM: CHEST  1 VIEW COMPARISON:  Single-view of the chest 08/12/2017. FINDINGS: Lungs are clear. Heart size is normal. No pneumothorax or pleural effusion. No acute bony abnormality. IMPRESSION: No acute disease. Electronically  Signed   By: Inge Rise M.D.   On: 08/15/2017 11:35   Ct Head Wo Contrast  Result Date: 08/12/2017 CLINICAL DATA:  Altered level of consciousness. EXAM: CT HEAD WITHOUT CONTRAST TECHNIQUE: Contiguous axial images were obtained from the base of the skull through the vertex without intravenous contrast. COMPARISON:  None FINDINGS: Brain: No evidence of acute infarction, hemorrhage, hydrocephalus, extra-axial collection or mass lesion/mass effect. Focal area of low attenuation within the proximal cervical cord may represent a syrinx. There is mild diffuse low-attenuation within the subcortical and periventricular white matter compatible with chronic microvascular disease. Prominence of sulci and ventricles identified compatible with brain atrophy. Vascular: No hyperdense vessel or unexpected calcification. Skull: Normal. Negative for  fracture or focal lesion. Sinuses/Orbits: No acute finding. Other: None IMPRESSION: 1. No acute intracranial abnormalities. 2. Chronic small vessel ischemic change and brain atrophy. Electronically Signed   By: Kerby Moors M.D.   On: 08/12/2017 17:09   Dg Cystogram  Result Date: 08/17/2017 CLINICAL DATA:  Left ureteral stent exchange EXAM: RETROGRADE PYELOGRAM COMPARISON:  08/12/2017 FINDINGS: Initial images demonstrate injection in the left ureter. The ureter is again dilated as is the collecting system. Left ureteral stent was then placed in satisfactory position. Distal left ureteral narrowing is noted just above the UVJ. Distal visualization of the right ureter shows no focal abnormality. A Foley catheter is seen within the bladder. IMPRESSION: Left ureteral stent exchange. Distal left ureteral narrowing. Electronically Signed   By: Inez Catalina M.D.   On: 08/17/2017 08:43   US Renal  Result Date: 08/16/2017 CLINICAL DATA:  Hydronephrosis. EXAM: RENAL / URINARY TRACT ULTRASOUND COMPLETE COMPARISON:  CT abdomen pelvis dated August 12, 2017. FINDINGS: Right Kidney: Length: 9.3 cm. Echogenicity within normal limits. No mass or hydronephrosis visualized. Small simple cyst measuring 1.2 cm. Left Kidney: Length: 9.2 cm. Echogenicity within normal limits. Stent visualized in the renal pelvis. Mild-to-moderate hydronephrosis, not significantly changed. No mass visualized. Bladder: There is layering, heterogeneously hypoechoic material in the posterior bladder. There is no internal vascularity. The bilateral ureteral jets are visualized. IMPRESSION: 1. Unchanged mild-to-moderate left hydronephrosis. Partially visualized stent in the left renal pelvis. 2. Layering, heterogeneously hypoechoic material in the posterior bladder without internal vascularity. While nonspecific, this may represent hematoma. Consider direct visualization for further evaluation. Electronically Signed   By: Titus Dubin M.D.   On:  08/16/2017 12:08   Dg Chest Port 1 View  Result Date: 08/12/2017 CLINICAL DATA:  Fever.  Altered mental status. EXAM: PORTABLE CHEST 1 VIEW COMPARISON:  None FINDINGS: The heart size and mediastinal contours are within normal limits. Both lungs are clear. The visualized skeletal structures are unremarkable. IMPRESSION: No active disease. Electronically Signed   By: Kerby Moors M.D.   On: 08/12/2017 16:53     Winfrey, Alcario Drought, MD 08/17/2017, 9:30 AM PGY-1, [place] Intern pager: [phone], text pages welcome

## 2017-08-17 NOTE — Care Management Important Message (Signed)
Important Message  Patient Details  Name: Micheal Fox MRN: [id number] Date of Birth: [birth year]   Medicare Important Message Given:  Yes    [name] P Yaak 08/17/2017, 12:46 PM

## 2017-08-17 NOTE — Progress Notes (Signed)
SLP Cancellation Note  Patient Details Name: Micheal Fox MRN: [id number] DOB: 1940-09-20   Cancelled treatment:       Reason Eval/Treat Not Completed: Medical issues which prohibited therapy; heart rate continues in 120's; will f/u next date for medical clearance for MBS.   Elvina Sidle, M.S., CCC-SLP 08/17/2017, 11:40 AM

## 2017-08-17 NOTE — Anesthesia Postprocedure Evaluation (Signed)
Anesthesia Post Note  Patient: [name]  Procedure(s) Performed: CYSTOSCOPY WITH BILATERAL  RETROGRADE PYELOGRAM LEFT URETERAL STENT EXCHANGE (Bilateral Bladder)     Patient location during evaluation: PACU Anesthesia Type: General Level of consciousness: awake and alert Pain management: pain level controlled Vital Signs Assessment: post-procedure vital signs reviewed and stable Respiratory status: spontaneous breathing, nonlabored ventilation, respiratory function stable and patient connected to nasal cannula oxygen Cardiovascular status: blood pressure returned to baseline and stable Postop Assessment: no apparent nausea or vomiting Anesthetic complications: no    Last Vitals:  Vitals:   08/17/17 0524 08/17/17 0907  BP: (!) 153/75 (!) 168/79  Pulse: (!) 112 (!) 118  Resp: 16 (!) 26  Temp: 37.8 C (!) 38.1 C  SpO2: 98% 99%    Last Pain:  Vitals:   08/17/17 0907  TempSrc: Oral  PainSc:                  Jordis Repetto,W. EDMOND

## 2017-08-17 NOTE — Clinical Social Work Note (Signed)
Clinical Social Work Assessment  Patient Details  Name: Micheal Fox MRN: [id number] Date of Birth: 1940/10/19  Date of referral:  08/13/17               Reason for consult:  Facility Placement, Discharge Planning                Permission sought to share information with:  Family Supports Permission granted to share information::  No(Patient not fully oriented and was quiet during conversation with wife at the bedside)  Name::     Valeria::     Relationship::  Wife  Contact Information:  [phone] and [phone]  Housing/Transportation Living arrangements for the past 2 months:  [place], Single Family Home(Patient was at home prior to hospitalization and then d/c to [facility]. on 5/29.) Source of Information:  Spouse, Other (Comment Required)(Chart review) Patient Interpreter Needed:  None Criminal Activity/Legal Involvement Pertinent to Current Situation/Hospitalization:  No - Comment as needed Significant Relationships:  Adult Children, Spouse, Other Family Members Lives with:  Facility Resident(Came to hospital from [facility]) Do you feel safe going back to the place where you live?  No(Per wife, d/c plan is back to [facility] at d/c from hospital) Need for family participation in patient care:     Care giving concerns:  Wife indicated that patient will return to [facility] once ready for discharge.  Social Worker assessment / plan:  CSW talked with wife at the bedside regarding discharge disposition. Mrs. Micheal Fox confirmed that patient came from [place] and her plan is for her husband to return there to continue rehab. Wife reported that they live together in their home in [place]. They have 7 grown children and 2 grandchildren.  Employment status:  Retired Research officer, political party) PT Recommendations:  Not assessed at this time Information / Referral to community resources:  Other (Comment Required)(None needed or  requested as d/c plan is back to SNF)  Patient/Family's Response to care:  Micheal Fox expressed no concerns regarding patient's care during hospitalization.  Patient/Family's Understanding of and Emotional Response to Diagnosis, Current Treatment, and Prognosis:  Wife appeared to understand that patient will need continuing rehab once ready discharge.  Emotional Assessment Appearance:  Appears stated age Attitude/Demeanor/Rapport:  Other(Quiet) Affect (typically observed):  Calm, Quiet Orientation:  Oriented to Self Alcohol / Substance use:  Tobacco Use, Alcohol Use, Illicit Drugs(Patient reported that he smokes cigarettes. No information on file regarding alcohol or illicit drug use) Psych involvement (Current and /or in the community):  No (Comment)  Discharge Needs  Concerns to be addressed:  Discharge Planning Concerns Readmission within the last 30 days:  No Current discharge risk:  None Barriers to Discharge:  Continued Medical Work up   [employer] Mila Homer, LCSW 08/17/2017, 3:24 PM

## 2017-08-18 ENCOUNTER — Inpatient Hospital Stay (HOSPITAL_COMMUNITY): Payer: Medicare Other

## 2017-08-18 DIAGNOSIS — F05 Delirium due to known physiological condition: Secondary | ICD-10-CM | POA: Diagnosis present

## 2017-08-18 DIAGNOSIS — L89610 Pressure ulcer of right heel, unstageable: Secondary | ICD-10-CM | POA: Diagnosis present

## 2017-08-18 LAB — BASIC METABOLIC PANEL
ANION GAP: 10 (ref 5–15)
BUN: 45 mg/dL — AB (ref 6–20)
CALCIUM: 8.8 mg/dL — AB (ref 8.9–10.3)
CO2: 13 mmol/L — AB (ref 22–32)
CREATININE: 1.53 mg/dL — AB (ref 0.61–1.24)
Chloride: 122 mmol/L — ABNORMAL HIGH (ref 101–111)
GFR calc Af Amer: 48 mL/min — ABNORMAL LOW (ref >60)
GFR, EST NON AFRICAN AMERICAN: 42 mL/min — AB (ref >60)
GLUCOSE: 185 mg/dL — AB (ref 65–99)
Potassium: 4.9 mmol/L (ref 3.5–5.1)
Sodium: 145 mmol/L (ref 135–145)

## 2017-08-18 LAB — CBC
HCT: 33.2 % — ABNORMAL LOW (ref 39.0–52.0)
Hemoglobin: 10.7 g/dL — ABNORMAL LOW (ref 13.0–17.0)
MCH: 29 pg (ref 26.0–34.0)
MCHC: 32.2 g/dL (ref 30.0–36.0)
MCV: 90 fL (ref 78.0–100.0)
PLATELETS: 222 10*3/uL (ref 150–400)
RBC: 3.69 MIL/uL — ABNORMAL LOW (ref 4.22–5.81)
RDW: 15.1 % (ref 11.5–15.5)
WBC: 17.3 10*3/uL — ABNORMAL HIGH (ref 4.0–10.5)

## 2017-08-18 MED ORDER — CLONIDINE HCL 0.1 MG/24HR TD PTWK
0.1000 mg | MEDICATED_PATCH | TRANSDERMAL | Status: DC
Start: 2017-08-18 — End: 2017-08-19
  Administered 2017-08-18: 0.1 mg via TRANSDERMAL
  Filled 2017-08-18: qty 1

## 2017-08-18 MED ORDER — CARVEDILOL 25 MG PO TABS: 25.0000 mg | ORAL_TABLET | Freq: Two times a day (BID) | ORAL | Status: DC

## 2017-08-18 MED ORDER — CLONIDINE HCL 0.1 MG/24HR TD PTWK
0.1000 mg | MEDICATED_PATCH | TRANSDERMAL | Status: DC
  Filled 2017-08-18: qty 1

## 2017-08-18 NOTE — Progress Notes (Signed)
Family Medicine Teaching Service Daily Progress Note Intern Pager: [phone]  Patient name: Micheal Fox Medical record number: [id number] Date of birth: [birth year] Age: 77 y.o. Gender: male  Primary Care Provider: Raelene Bott, MD Consultants: Urology Code Status: full  Pt Overview and Major Events to Date:  Admitted 6/16 Started cefepime and vancomycin; stopped vancomycin 6/17 Switched abx to Keflex 6/18 Escalated abx to ceftriaxone 6/19 Increased ceftriaxone dose 6/20 Ureteral stent exchanged on 6/20 AMS improved 6/21  Assessment and Plan:  Micheal Fox is a 77 y.o. male with a past medical history significant for hypertension, CAD status post cardiac stent, type 2 diabetes, CKD-III, peripheral artery disease, ureteral stone, prostate cancer, sciatica, gout who presents with altered mental status in the setting of urinary tract infection.    Altered mental status, acute, improving Likely due to infection, although he does have deficits at baseline due to previous [place].  Question delerium or sundowning, minimally verbal this am after awakening.  --dysphasia one diet, will likely improve with better concentration according to SLP --Acetaminophen 650 mg every 6 as needed - foley in place - modified barium swallow study per SLP request  Pyelonephritis Last fever 0900 6/21, WBC increasing from 14.4>17.1 6/22.  - continue ceftriaxone 2 g daily  Hypernatremia, improving  Sodium normalized 6/22.  - continue 1/2NS @ 125 ml/hr  AKI on CKD3, improving On admission patient's creatinine is 2.45, is 2.09 on 6/21.  Baseline appears to be around 1.7-1.8 based on [facility] record.  Cr at baseline today. --1/2NS @ 125 ml/hr  Hypertension Patient's blood pressure has been high during admission, last reading 168/79.  Patient is on lisinopril, amlodipine, metoprolol at home.   - lisinopril stopped on 6/20 given patient's renal function - increase Coreg 25  mg BID --continue amlodipine --PRN IV  Labetalol for SBP >160, DPB>100 - IV hydralazine PRN for SBP>160, DPB>100 if labetalol ineffective  Hyperlipidemia Patient is currently on Pravachol 40 mg.  We will continue home regimen.  Last lipid panel showed HDL 36, triglyceride 108, LDL 113, total cholesterol 170 -continue pravastatin  CAD status post stenting 2004 Patient has history of stent for which he is on Plavix and aspirin.  Plavix was recently held in the setting of recent subarachnoid hemorrhage while at [facility].  Patient told to resume Plavix on 6/12 after discussing with PCP.  Patient was maintained on aspirin therapy.  It appears that patient has not resume Plavix while at the SNF.  Troponin has been negative.  Recent stress test May 2019 at [facility] showed normal perfusion studies there is no evidence of ischemia and EF of 65%.  Type 2 diabetes Most recent A1c 7.2.  Likely will not need glycemic control while in hospital given age and A1c. - monitor on daily BMP  PAD Arterial duplex on 4/8 2019 show evidence of arterial obstruction involving the SFA and or popliteal artery and tibioperoneal vessels.  Sciatica, chronic  Patient has been taking gabapentin 300 mg 3 times daily as well as tramadol for pain control.  Patient will also on Flexeril.  Continue to monitor symptoms.  Prostate cancer s/p radical prostatectomy in 2007 Followed by urology at [facility]. --Continue tamsulosin 0.4 mg daily  Gout --Continue allopurinol   Tobacco use Two cigarettes daily  FEN/GI: dysphagia 1 diet PPx: SCDs  Disposition: SNF  Subjective:  Patient will follow commands this morning, but says no when asked to give orientation answers. RN has not had him before, will recheck.   Objective: Temp:  [97.4  F (36.3 C)-100.6 F (38.1 C)] 97.4 F (36.3 C) (06/22 0446) Pulse Rate:  [103-125] 125 (06/22 0446) Resp:  [18-38] 24 (06/22 0600) BP: (166-185)/(79-89) 167/81 (06/22 0446) SpO2:  [99 %-100 %] 100 % (06/22 0600) Physical  Exam: General: thin, elderly man lying in bed, squeezed my hand on command.  Cardiovascular: tachycardic, regular rhythm Respiratory: CTAB Abdomen: soft, nontender, nondistended Extremities: thin, no edema, warm  Laboratory: Recent Labs  Lab 08/15/17 0901 08/16/17 0536 08/17/17 0704  WBC 11.2* 12.6* 14.4*  HGB 11.5* 12.4* 10.4*  HCT 35.9* 39.6 32.8*  PLT 299 341 249   Recent Labs  Lab 08/12/17 1741  08/16/17 0536 08/16/17 1421 08/17/17 0704  NA 137   < > 153* 152* 149*  K 5.5*   < > 4.9 4.5 4.9  CL 103   < > 121* 123* 126*  CO2 21*   < > 19* 17* 14*  BUN 62*   < > 50* 52* 60*  CREATININE 2.45*   < > 2.00* 1.92* 2.09*  CALCIUM 10.1   < > 10.2 9.9 8.8*  PROT 8.9*  --   --   --   --   BILITOT 0.6  --   --   --   --   ALKPHOS 84  --   --   --   --   ALT 12*  --   --   --   --   AST 26  --   --   --   --   GLUCOSE 173*   < > 209* 188* 176*   < > = values in this interval not displayed.    Imaging/Diagnostic Tests: No new imaging.   Sela Hilding, MD 08/18/2017, 8:53 AM PGY-2, [place] Intern pager: [phone], text pages welcome

## 2017-08-18 NOTE — Progress Notes (Signed)
Modified Barium Swallow Progress Note  Patient Details  Name: Brailyn Delman MRN: [id number] Date of Birth: [birth year]  Today's Date: 08/18/2017  Modified Barium Swallow completed.  Full report located under Chart Review in the Imaging Section.  Brief recommendations include the following:  Clinical Impression  Patient exhibited a severe oropharyngeal dysphagia which is likely secondary to current cognitive impairment. Patient required maximal cues to initiate swallow of all consistencies and exhibited delays in swallow initiation to level of vallecular and pyriform sinuses. Full clearance of bolus from pharyngeal cavity was observed when swallow initiated and no residuals remained. Patient exhibited one instance of aspiration of small amount of nectar liquids during the swallow, and patient did not cough.(silent aspiration). Patient likely will be able to return to diet when his cognitive status improves, but currently he is not safe for PO's (except meds in puree).    Swallow Evaluation Recommendations       SLP Diet Recommendations: NPO       Medication Administration: Other (Comment)(meds in puree )           Postural Changes: Seated upright at 90 degrees   Oral Care Recommendations: Oral care BID       Sonia Baller, MA, CCC-SLP 08/18/17 1:15 PM

## 2017-08-18 NOTE — Progress Notes (Addendum)
Discussed CT head finding with multiple specialists: - Per radiology, not able to see previous CT head done at [facility] in May 2019 only the report. Motion artifact but trace SAH may be present since can see some blood layering. - Called neurology NP for curbside. Discussed the CT head findings are very subtle and unlikely to be clinically significant. Recommended that could do EEG to rule out subclinical sz, ammonia, and good BP control.  - Called neurosurg NP for curbside. Goal BP <160 and no need for repeat CT angio after reviewing CT head.  Bufford Lope, DO PGY-2, [facility] 08/18/2017 6:54 PM

## 2017-08-19 DIAGNOSIS — G9389 Other specified disorders of brain: Secondary | ICD-10-CM

## 2017-08-19 DIAGNOSIS — R251 Tremor, unspecified: Secondary | ICD-10-CM

## 2017-08-19 DIAGNOSIS — I609 Nontraumatic subarachnoid hemorrhage, unspecified: Secondary | ICD-10-CM

## 2017-08-19 DIAGNOSIS — E43 Unspecified severe protein-calorie malnutrition: Secondary | ICD-10-CM

## 2017-08-19 DIAGNOSIS — M24561 Contracture, right knee: Secondary | ICD-10-CM | POA: Clinically undetermined

## 2017-08-19 DIAGNOSIS — H0100B Unspecified blepharitis left eye, upper and lower eyelids: Secondary | ICD-10-CM

## 2017-08-19 DIAGNOSIS — M24562 Contracture, left knee: Secondary | ICD-10-CM

## 2017-08-19 DIAGNOSIS — H01006 Unspecified blepharitis left eye, unspecified eyelid: Secondary | ICD-10-CM

## 2017-08-19 DIAGNOSIS — H0100A Unspecified blepharitis right eye, upper and lower eyelids: Secondary | ICD-10-CM

## 2017-08-19 DIAGNOSIS — H01003 Unspecified blepharitis right eye, unspecified eyelid: Secondary | ICD-10-CM | POA: Diagnosis present

## 2017-08-19 DIAGNOSIS — I10 Essential (primary) hypertension: Secondary | ICD-10-CM

## 2017-08-19 DIAGNOSIS — N1339 Other hydronephrosis: Secondary | ICD-10-CM

## 2017-08-19 DIAGNOSIS — L89610 Pressure ulcer of right heel, unstageable: Secondary | ICD-10-CM

## 2017-08-19 DIAGNOSIS — E7220 Disorder of urea cycle metabolism, unspecified: Secondary | ICD-10-CM

## 2017-08-19 DIAGNOSIS — M2450 Contracture, unspecified joint: Secondary | ICD-10-CM | POA: Clinically undetermined

## 2017-08-19 LAB — URINALYSIS, ROUTINE W REFLEX MICROSCOPIC
BILIRUBIN URINE: NEGATIVE
Bacteria, UA: NONE SEEN
GLUCOSE, UA: NEGATIVE mg/dL
Ketones, ur: NEGATIVE mg/dL
Nitrite: NEGATIVE
Protein, ur: 100 mg/dL — AB
SPECIFIC GRAVITY, URINE: 1.013 (ref 1.005–1.030)
WBC, UA: >50 WBC/hpf — ABNORMAL HIGH (ref 0–5)
pH: 5 (ref 5.0–8.0)

## 2017-08-19 LAB — BASIC METABOLIC PANEL
ANION GAP: 11 (ref 5–15)
BUN: 37 mg/dL — ABNORMAL HIGH (ref 6–20)
CHLORIDE: 118 mmol/L — AB (ref 101–111)
CO2: 15 mmol/L — ABNORMAL LOW (ref 22–32)
Calcium: 8.8 mg/dL — ABNORMAL LOW (ref 8.9–10.3)
Creatinine, Ser: 1.48 mg/dL — ABNORMAL HIGH (ref 0.61–1.24)
GFR calc Af Amer: 50 mL/min — ABNORMAL LOW (ref >60)
GFR, EST NON AFRICAN AMERICAN: 43 mL/min — AB (ref >60)
GLUCOSE: 164 mg/dL — AB (ref 65–99)
POTASSIUM: 4.8 mmol/L (ref 3.5–5.1)
Sodium: 144 mmol/L (ref 135–145)

## 2017-08-19 LAB — AMMONIA: AMMONIA: 44 umol/L — AB (ref 9–35)

## 2017-08-19 LAB — CBC
HEMATOCRIT: 33 % — AB (ref 39.0–52.0)
HEMOGLOBIN: 10.8 g/dL — AB (ref 13.0–17.0)
MCH: 29.2 pg (ref 26.0–34.0)
MCHC: 32.7 g/dL (ref 30.0–36.0)
MCV: 89.2 fL (ref 78.0–100.0)
Platelets: 229 10*3/uL (ref 150–400)
RBC: 3.7 MIL/uL — ABNORMAL LOW (ref 4.22–5.81)
RDW: 15.1 % (ref 11.5–15.5)
WBC: 15.1 10*3/uL — ABNORMAL HIGH (ref 4.0–10.5)

## 2017-08-19 MED ORDER — CLONIDINE HCL 0.2 MG/24HR TD PTWK: 0.2000 mg | MEDICATED_PATCH | TRANSDERMAL | Status: DC

## 2017-08-19 MED ORDER — CARVEDILOL 25 MG PO TABS: 25.0000 mg | ORAL_TABLET | Freq: Two times a day (BID) | ORAL | Status: DC

## 2017-08-19 MED ORDER — CLONIDINE HCL 0.1 MG/24HR TD PTWK: 0.1000 mg | MEDICATED_PATCH | TRANSDERMAL | Status: DC

## 2017-08-19 MED ORDER — LISINOPRIL 20 MG PO TABS: 20.0000 mg | ORAL_TABLET | Freq: Every day | ORAL | Status: DC

## 2017-08-19 MED ORDER — METOPROLOL TARTRATE 5 MG/5ML IV SOLN
5.0000 mg | Freq: Four times a day (QID) | INTRAVENOUS | Status: DC | PRN
  Administered 2017-08-19 – 2017-08-21 (×4): 5 mg via INTRAVENOUS
  Filled 2017-08-19 (×4): qty 5

## 2017-08-19 MED ORDER — LACTULOSE ENEMA
300.0000 mL | Freq: Once | ORAL | Status: AC
  Administered 2017-08-19: 300 mL via RECTAL
  Filled 2017-08-19: qty 300

## 2017-08-19 NOTE — Progress Notes (Signed)
  Speech Language Pathology Treatment: Dysphagia  Patient Details Name: Micheal Fox MRN: [id number] DOB: [birth year] Today's Date: 08/19/2017 Time: 5038-8828 SLP Time Calculation (min) (ACUTE ONLY): 22 min  Assessment / Plan / Recommendation Clinical Impression  ST follow up for PO readiness.  Per nurse the patient remains confused.  They have been giving medications crushed in purees.  Patient easily roused when ST entered room.  He was unable to follow any commands even given max cues.  Oral care using suction was provided.   Patient was presented with an ice chips, thin liquids by spoon sips and pureed material.  He was only observed to trigger a pharyngeal swallow on 1 spoon sip of thin liquids.  All other bolus trials were held in his oral cavity for a long period of time.  The patient was eventually orally suctioned to removed material.   Patient continues to present with a cognitive based dysphagia.  Suspect he will have issues even taking his medications crushed in pureed material.  ST will follow up next date for PO readiness.     HPI HPI: Micheal Fox is a 77 y.o. male with a past medical history significant for recent CVA, hypertension, CAD status post cardiac stent, type 2 diabetes, CKD-III, peripheral artery disease, ureteral stone, prostate cancer, sciatica, gout who presents today with altered mental status in the setting of urinary tract infection.    Most recent head CT is showing trace SAH vs artifact right greater then left sylvian fissures and right posterior fossa.        SLP Plan  Continue with current plan of care       Recommendations  Diet recommendations: NPO Medication Administration: Crushed with puree                Oral Care Recommendations: Oral care QID Follow up Recommendations: Skilled Nursing facility SLP Visit Diagnosis: Dysphagia, oropharyngeal phase (R13.12) Plan: Continue with current plan of care       [place], [place],  [facility] SLP [phone] Lamar Sprinkles 08/19/2017, 9:32 AM

## 2017-08-19 NOTE — Progress Notes (Signed)
FPTS Interim Note:   Called by RN with relative hypotension (BP 100s/70s) and continued tachycardia. He has received the enema, but no other PO meds today. Clonidine patch had not yet been changed to 0.2mg . On my assessment, he continues to be altered, nontender to abdominal exam. Full recheck of vital signs when I was examining him included BPs 111/57 in R arm and 147/80 in L arm. Tachycardia was reproduced. Discussed with RN, we will discontinue the ordered lisinopril as she is not comfortable with his ability to swallow PO and we will switch from labetalol to IV metoprolol for longer acting BB and hold coreg tonight. Will continue to reassess. I will also call and update wife.   Ralene Ok, MD

## 2017-08-19 NOTE — Evaluation (Signed)
Physical Therapy Evaluation Patient Details Name: Micheal Fox MRN: [id number] DOB: 11/20/1940 Today's Date: 08/19/2017   History of Present Illness  77 y.o. male presented from [facility] facility where he has been receiving therapy after recent discharge from [facility] for rt posterior fossa SAH/IVH on 5/29. Presented with altered mental status in the setting of urinary tract infection.  08/16/17 underwent left ureteral stent exchange. PMH significant for hypertension, CAD status post cardiac stent, type 2 diabetes, CKD-III, peripheral artery disease, ureteral stone, prostate cancer, sciatica, gout     Clinical Impression  Pt admitted with above diagnosis. Per discharge summary from [facility] 07/25/17, pt was ambulatory and participating with PT/OT with recommended SNF for rehab. Patient currently with significantly impaired cognition and not able to follow commands. Pt currently with functional limitations due to the deficits listed below (see PT Problem List).  Pt may benefit from skilled PT (if cognitively improves) to increase their independence and safety with mobility to allow discharge to the venue listed below.       Follow Up Recommendations SNF;Supervision/Assistance - 24 hour(if becomes alert and able to participate)    Equipment Recommendations  None recommended by PT    Recommendations for Other Services       Precautions / Restrictions Precautions Precautions: Fall      Mobility  Bed Mobility Overal bed mobility: Needs Assistance Bed Mobility: Rolling Rolling: Total assist            Transfers                 General transfer comment: unable to assist  Ambulation/Gait                Stairs            Wheelchair Mobility    Modified Rankin (Stroke Patients Only)       Balance                                             Pertinent Vitals/Pain Pain Assessment: Faces Faces Pain Scale: Hurts little more Pain  Location: bil knees with PROM Pain Descriptors / Indicators: Grimacing;Guarding Pain Intervention(s): Limited activity within patient's tolerance;Repositioned    Home Living Family/patient expects to be discharged to:: Skilled nursing facility                      Prior Function           Comments: per d/c from [facility] hospital 07/25/17 pt was ambulatory, participating with PT/OT and referred to SNF for therapy 5 days/wk     Hand Dominance        Extremity/Trunk Assessment   Upper Extremity Assessment Upper Extremity Assessment: Defer to OT evaluation(bil elbow contractures; unable to squeeze)    Lower Extremity Assessment Lower Extremity Assessment: RLE deficits/detail;LLE deficits/detail;Difficult to assess due to impaired cognition RLE Deficits / Details: 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); ankle DF to neutral LLE Deficits / Details: 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); ankle DF to neutral       Communication   Communication: Other (comment)(non-communicative)  Cognition Arousal/Alertness: Awake/alert;Lethargic Behavior During Therapy: Flat affect Overall Cognitive Status: No family/caregiver present to determine baseline cognitive functioning  General Comments: unable to state name; no response when asked if his name is Fritz Pickerel; no response when asked if name is Roylene Reason Comments General comments (skin integrity, edema, etc.): Rt heel with foam dressing that is partially detached with green drainage noted and underlying sheet soaked with drainage     Exercises     Assessment/Plan    PT Assessment Patient needs continued PT services  PT Problem List Decreased strength;Decreased range of motion;Decreased activity tolerance;Decreased mobility;Decreased cognition;Pain;Decreased skin integrity       PT Treatment Interventions DME instruction;Gait training;Functional mobility  training;Therapeutic activities;Therapeutic exercise;Balance training;Neuromuscular re-education;Cognitive remediation;Patient/family education    PT Goals (Current goals can be found in the Care Plan section)  Acute Rehab PT Goals Patient Stated Goal: pt unable to state PT Goal Formulation: Patient unable to participate in goal setting Time For Goal Achievement: 09/02/17 Potential to Achieve Goals: Poor(based on current cognition)    Frequency Min 2X/week   Barriers to discharge Decreased caregiver support      Co-evaluation               AM-PAC PT "6 Clicks" Daily Activity  Outcome Measure Difficulty turning over in bed (including adjusting bedclothes, sheets and blankets)?: Unable Difficulty moving from lying on back to sitting on the side of the bed? : Unable Difficulty sitting down on and standing up from a chair with arms (e.g., wheelchair, bedside commode, etc,.)?: Unable Help needed moving to and from a bed to chair (including a wheelchair)?: Total Help needed walking in hospital room?: Total Help needed climbing 3-5 steps with a railing? : Total 6 Click Score: 6    End of Session   Activity Tolerance: Patient limited by pain;Treatment limited secondary to medical complications (Comment)(decreased cognition or ability to follow commands) Patient left: in bed;with call [place]/phone within reach;with bed alarm set Nurse Communication: Other (comment)(heel dressing needs changing) PT Visit Diagnosis: Muscle weakness (generalized) (M62.81);Pain Pain - Right/Left: Right Pain - part of body: Knee    Time: 8185-6314 PT Time Calculation (min) (ACUTE ONLY): 16 min   Charges:   PT Evaluation $PT Eval Low Complexity: 1 Low     PT G Codes:          [employer], PT 08/19/2017, 10:12 AM

## 2017-08-19 NOTE — Evaluation (Signed)
Occupational Therapy Evaluation Patient Details Name: Micheal Fox MRN: [id number] DOB: 17-Feb-1941 Today's Date: 08/19/2017    History of Present Illness 77 y.o. male presented from 67 long-term facility where he has been receiving therapy after recent discharge from [facility] for rt posterior fossa SAH/IVH on 5/29. Presented with altered mental status in the setting of urinary tract infection.  08/16/17 underwent left ureteral stent exchange. PMH significant for hypertension, CAD status post cardiac stent, type 2 diabetes, CKD-III, peripheral artery disease, ureteral stone, prostate cancer, sciatica, gout    Clinical Impression   PTA, pt was at [facility] and likely requiring assistance for ADL participation. Per chart, he was actively engaged with PT/OT at [facility] prior to previous discharge from that facility to SNF. Pt currently requiring total assistance for all ADl and mobility tasks. He was able to verbalize "yes" when asked if PROM of L UE hurt but then did not respond when asked where he was hurting. He demonstrates focused attention and was not able to follow commands this session. Noted contractures in B UE. Pt would benefit from continued OT services while admitted to improve independence and safety with ADL and functional mobility. Recommend SNF level rehabilitation post-acute D/C.     Follow Up Recommendations  SNF;Supervision/Assistance - 24 hour    Equipment Recommendations  Other (comment)(defer to next venue of care)    Recommendations for Other Services       Precautions / Restrictions Precautions Precautions: Fall Restrictions Weight Bearing Restrictions: No      Mobility Bed Mobility Overal bed mobility: Needs Assistance Bed Mobility: Rolling Rolling: Total assist         General bed mobility comments: Requiring total assistance.   Transfers                 General transfer comment: unsafe to assess    Balance                                           ADL either performed or assessed with clinical judgement   ADL Overall ADL's : Needs assistance/impaired                                       General ADL Comments: Total assistance for all ADL participation.      Vision   Additional Comments: Pt able to track therapist but unable to follow commands to track finger. Will continue to assess.      Perception     Praxis      Pertinent Vitals/Pain Pain Assessment: Faces Faces Pain Scale: Hurts little more Pain Location: bil knees with PROM Pain Descriptors / Indicators: Grimacing;Guarding Pain Intervention(s): Limited activity within patient's tolerance;Monitored during session;Repositioned     Hand Dominance     Extremity/Trunk Assessment Upper Extremity Assessment Upper Extremity Assessment: RUE deficits/detail;LUE deficits/detail RUE Deficits / Details: Very resistive to movement with grimacing. Minimal tolerance for shoulder PROM and elbow PROM lacking approximately 15 degrees extension.  LUE Deficits / Details: Very tight at elbow and shoulder and pt grimacing with passive motion. Limited to 0-90 degrees shoulder flexion and abduction passively. Lacking ~15 degrees elbow extension.    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 Unable to state his name.  Does verbalize "yes" when asked if moving L UE hurt but no response when asked where.    General Comments  Rt heel with foam dressing that is partially detached with green drainage noted and underlying sheet soaked with drainage     Exercises     Shoulder Instructions      Home Living Family/patient expects to be discharged to:: Skilled nursing facility                                        Prior Functioning/Environment          Comments: per d/c from [facility] 07/25/17 pt was ambulatory, participating with PT/OT and referred to SNF for therapy 5 days/wk        OT Problem List: Decreased activity tolerance;Impaired balance (sitting and/or standing);Decreased strength;Decreased range of motion;Impaired vision/perception;Decreased coordination;Decreased cognition;Decreased safety awareness;Decreased knowledge of use of DME or AE;Decreased knowledge of precautions;Pain;Impaired UE functional use      OT Treatment/Interventions: Self-care/ADL training;Therapeutic exercise;Energy conservation;DME and/or AE instruction;Therapeutic activities;Patient/family education;Balance training;Cognitive remediation/compensation    OT Goals(Current goals can be found in the care plan section) Acute Rehab OT Goals Patient Stated Goal: unable to state goals OT Goal Formulation: With patient Time For Goal Achievement: 09/02/17 Potential to Achieve Goals: Fair ADL Goals Pt/caregiver will Perform Home Exercise Program: Increased ROM;Both right and left upper extremity;With written HEP provided;With minimal assist Additional ADL Goal #1: Pt will follow 2/3 simple one step commands during grooming tasks. Additional ADL Goal #2: Pt will complete bed mobility with overall mod assist in preparation for ADL participation.  OT Frequency: Min 2X/week   Barriers to D/C:            Co-evaluation              AM-PAC PT "6 Clicks" Daily Activity     Outcome Measure Help from  another person eating meals?: Total Help from another person taking care of personal grooming?: Total Help from another person toileting, which includes using toliet, bedpan, or urinal?: Total Help from another person bathing (including washing, rinsing, drying)?: Total Help from another person to put on and taking off regular upper body clothing?: Total Help from another person to put on and taking off regular lower body clothing?: Total 6 Click Score: 6   End of Session Nurse Communication: Mobility status  Activity Tolerance: Patient tolerated treatment well Patient left: in bed;with call [place]/phone within reach;with bed alarm set  OT Visit Diagnosis: Other abnormalities of gait and mobility (R26.89);Pain                Time: 1209-1219 OT Time Calculation (min): 10 min Charges:  OT General Charges $OT Visit: 1 Visit OT Evaluation $OT Eval Low Complexity: 1 Low G-Codes:     Norman Herrlich, MS OTR/L  Pager: [place]  A Yakub Lodes 08/19/2017, 1:30 PM

## 2017-08-19 NOTE — Progress Notes (Signed)
Family Medicine Teaching Service Daily Progress Note Intern Pager: [phone]  Patient name: [facility] Medical record number: [id number] Date of birth: 1940/11/13 Age: 77 y.o. Gender: male  Primary Care Provider: Raelene Bott, MD Consultants: Urology Code Status: full  Pt Overview and Major Events to Date:  Admitted 6/16 Started cefepime and vancomycin; stopped vancomycin 6/17 Switched abx to Keflex 6/18 Escalated abx to ceftriaxone 6/19 Increased ceftriaxone dose 6/20 Ureteral stent exchanged on 6/20 AMS improved 6/21 6/22 - CT head repeated with motion artifact, ?trace SAH   Assessment and Plan:  [facility] is a 77 y.o. male with a past medical history significant for hypertension, CAD status post cardiac stent, type 2 diabetes, CKD-III, peripheral artery disease, ureteral stone, prostate cancer, sciatica, gout who presents with altered mental status in the setting of urinary tract infection.    Altered mental status, acute, persistent Likely due to infection, although he does have deficits at baseline due to previous [facility].  Question delerium or sundowning, minimally verbal this am after awakening. CT head 6/22 without significant change. Expanded workup overnight, including EEG and ammonia. Ammonia elevated, although appears noncirrhotic hyperammonemia, will treat with lactulose.  --dysphasia one diet, will likely improve with better concentration according to SLP --Acetaminophen 650 mg every 6 as needed - foley in place - modified barium swallow study per SLP request - lactulose enema  Pyelonephritis Last fever 0900 6/21, WBC increasing from 14.4>17.1 6/22.  - continue ceftriaxone 2 g daily  Hypernatremia, improving  Sodium normalized 6/22.  - continue 1/2NS @ 125 ml/hr  AKI on CKD3, improving On admission patient's creatinine is 2.45, is 2.09 on 6/21.  Baseline appears to be around 1.7-1.8 based on [facility] record.  Cr at baseline today. --1/2NS @ 125  ml/hr  Hypertension Patient's blood pressure has been high during admission, last reading 168/79.  Patient is on lisinopril, amlodipine, metoprolol at home.   - lisinopril stopped on 6/20 given patient's renal function, restart today - increase Coreg 25  mg BID --continue amlodipine -clonidine added 6/22, increase to 0.2mg  6/23 --PRN IV Labetalol for SBP >160, DPB>100 - IV hydralazine PRN for SBP>160, DPB>100 if labetalol ineffective  Hyperlipidemia Patient is currently on Pravachol 40 mg.  We will continue home regimen.  Last lipid panel showed HDL 36, triglyceride 108, LDL 113, total cholesterol 170 -continue pravastatin  CAD status post stenting 2004 Patient has history of stent for which he is on Plavix and aspirin.  Plavix was recently held in the setting of recent subarachnoid hemorrhage [facility]  Patient told to resume Plavix on 6/12 after discussing with PCP.  Patient was maintained on aspirin therapy.  It appears that patient has not resume Plavix while at the SNF.  Troponin has been negative.  Recent stress test May 2019 at [facility] showed normal perfusion studies there is no evidence of ischemia and EF of 65%.  Type 2 diabetes Most recent A1c 7.2.  Likely will not need glycemic control while in [facility] given age and A1c. - monitor on daily BMP  PAD Arterial duplex on 4/8 2019 show evidence of arterial obstruction involving the SFA and or popliteal artery and tibioperoneal vessels.  Sciatica, chronic  Patient has been taking gabapentin 300 mg 3 times daily as well as tramadol for pain control.  Patient will also on Flexeril.  Continue to monitor symptoms.  Prostate cancer s/p radical prostatectomy in 2007 Followed by urology at [facility]. --Continue tamsulosin 0.4 mg daily  Gout --Continue allopurinol   Tobacco  use Two cigarettes daily  FEN/GI: dysphagia 1 diet PPx: SCDs  Disposition: SNF  Subjective:  Patient will follow simple commands, nonverbal. Tracks  with eyes, shakes head no to pain.   Objective: Temp:  [98.8 F (37.1 C)-99.6 F (37.6 C)] 98.8 F (37.1 C) (06/23 0357) Pulse Rate:  [99-124] 124 (06/23 0357) Resp:  [18-34] 22 (06/23 0357) BP: (168-197)/(80-99) 179/89 (06/23 0600) SpO2:  [98 %-100 %] 98 % (06/23 0357) Weight:  [142 lb 10.2 oz (64.7 kg)] 142 lb 10.2 oz (64.7 kg) (06/23 0500) Physical Exam: General: thin, elderly man lying in bed, squeezed my hand on command.  Cardiovascular: tachycardic, regular rhythm Respiratory: CTAB Abdomen: soft, nontender, nondistended Extremities: thin, no edema, warm  Laboratory: Recent Labs  Lab 08/16/17 0536 08/17/17 0704 08/18/17 0834  WBC 12.6* 14.4* 17.3*  HGB 12.4* 10.4* 10.7*  HCT 39.6 32.8* 33.2*  PLT 341 249 222   Recent Labs  Lab 08/12/17 1741  08/16/17 1421 08/17/17 0704 08/18/17 0834  NA 137   < > 152* 149* 145  K 5.5*   < > 4.5 4.9 4.9  CL 103   < > 123* 126* 122*  CO2 21*   < > 17* 14* 13*  BUN 62*   < > 52* 60* 45*  CREATININE 2.45*   < > 1.92* 2.09* 1.53*  CALCIUM 10.1   < > 9.9 8.8* 8.8*  PROT 8.9*  --   --   --   --   BILITOT 0.6  --   --   --   --   ALKPHOS 84  --   --   --   --   ALT 12*  --   --   --   --   AST 26  --   --   --   --   GLUCOSE 173*   < > 188* 176* 185*   < > = values in this interval not displayed.    Imaging/Diagnostic Tests: Ct Abdomen Pelvis Wo Contrast  Result Date: 08/12/2017 CLINICAL DATA:  pyelonephritis EXAM: CT ABDOMEN AND PELVIS WITHOUT CONTRAST TECHNIQUE: Multidetector CT imaging of the abdomen and pelvis was performed following the standard protocol without IV contrast. COMPARISON:  CT 05/19/2015 FINDINGS: Lower chest: Lung bases are clear. Hepatobiliary: Central hepatic cysts. Gallbladder normal. No biliary duct dilatation. No IV contrast Pancreas: Pancreas is normal. No ductal dilatation. No pancreatic inflammation. Spleen: Normal spleen Adrenals/urinary tract: Adrenal glands normal. Ureteral stent within the LEFT renal  pelvis extending to the bladder. Mild hydronephrosis and hydroureter on the LEFT. RIGHT ureter mildly dilated to lesser degree. Bladder normal Stomach/Bowel: Stomach, small bowel, appendix, and cecum are normal. The colon and rectosigmoid colon are normal. Vascular/Lymphatic: Abdominal aorta is normal caliber with atherosclerotic calcification. There is no retroperitoneal or periportal lymphadenopathy. No pelvic lymphadenopathy. Reproductive: Prostate poorly defined. There calcifications and vascular clips prostate bed Other: No free fluid. Musculoskeletal: No aggressive osseous lesion. IMPRESSION: 1. Hydronephrosis and hydroureter on the LEFT with ureteral stent in place. 2. Minimal hydroureter on the RIGHT. 3. Bladder normal. 4. Postsurgical change in the prostate bed. Electronically Signed   By: Suzy Bouchard M.D.   On: 08/12/2017 22:09   Dg Chest 1 View  Result Date: 08/15/2017 CLINICAL DATA:  Altered mental status.  Acute kidney injury. EXAM: CHEST  1 VIEW COMPARISON:  Single-view of the chest 08/12/2017. FINDINGS: Lungs are clear. Heart size is normal. No pneumothorax or pleural effusion. No acute bony abnormality. IMPRESSION: No acute disease.  Electronically Signed   By: Inge Rise M.D.   On: 08/15/2017 11:35   Ct Head Wo Contrast  Result Date: 08/18/2017 CLINICAL DATA:  Altered mental status. Subarachnoid hemorrhage last month. EXAM: CT HEAD WITHOUT CONTRAST TECHNIQUE: Contiguous axial images were obtained from the base of the skull through the vertex without intravenous contrast. COMPARISON:  08/12/2017 FINDINGS: Brain: There is mild motion artifact. Trace subarachnoid hemorrhage is questioned posteriorly in the right sylvian fissure and over the lateral aspect of the right cerebellar hemisphere, however either or both of these may be artifactual. A tiny amount of subarachnoid blood is also not excluded posteriorly in the left sylvian fissure. No extra-axial fluid collection, acute large  territory infarct, or midline shift is present. Mild-to-moderate ventriculomegaly is unchanged with temporal horn dilatation which may reflect a component of communicating hydrocephalus related to last month's subarachnoid hemorrhage. Subcortical and periventricular white matter hypodensities are unchanged and nonspecific though may reflect moderate chronic small vessel ischemic disease and/or transependymal CSF flow. Vascular: Calcified atherosclerosis at the skull base. No hyperdense vessel. Skull: No fracture or focal osseous lesion. Sinuses/Orbits: Persistent left frontal sinus and partial anterior left ethmoid air cell opacification. Clear mastoid air cells. Bilateral cataract extraction. Other: None. IMPRESSION: 1. Trace subarachnoid hemorrhage versus artifact in the right greater than left sylvian fissures and right posterior fossa. 2. No extra-axial fluid collection or evidence of acute infarct. 3. Unchanged ventriculomegaly and periventricular white matter hypoattenuation as described above. These results will be called to the ordering clinician or representative by the Radiologist Assistant, and communication documented in the PACS or zVision Dashboard. Electronically Signed   By: Logan Bores M.D.   On: 08/18/2017 15:08   Ct Head Wo Contrast  Result Date: 08/12/2017 CLINICAL DATA:  Altered level of consciousness. EXAM: CT HEAD WITHOUT CONTRAST TECHNIQUE: Contiguous axial images were obtained from the base of the skull through the vertex without intravenous contrast. COMPARISON:  None FINDINGS: Brain: No evidence of acute infarction, hemorrhage, hydrocephalus, extra-axial collection or mass lesion/mass effect. Focal area of low attenuation within the proximal cervical cord may represent a syrinx. There is mild diffuse low-attenuation within the subcortical and periventricular white matter compatible with chronic microvascular disease. Prominence of sulci and ventricles identified compatible with brain  atrophy. Vascular: No hyperdense vessel or unexpected calcification. Skull: Normal. Negative for fracture or focal lesion. Sinuses/Orbits: No acute finding. Other: None IMPRESSION: 1. No acute intracranial abnormalities. 2. Chronic small vessel ischemic change and brain atrophy. Electronically Signed   By: Kerby Moors M.D.   On: 08/12/2017 17:09   Dg Cystogram  Result Date: 08/17/2017 CLINICAL DATA:  Left ureteral stent exchange EXAM: RETROGRADE PYELOGRAM COMPARISON:  08/12/2017 FINDINGS: Initial images demonstrate injection in the left ureter. The ureter is again dilated as is the collecting [place]. Left ureteral stent was then placed in satisfactory position. Distal left ureteral narrowing is noted just above the UVJ. Distal visualization of the right ureter shows no focal abnormality. A Foley catheter is seen within the bladder. IMPRESSION: Left ureteral stent exchange. Distal left ureteral narrowing. Electronically Signed   By: Inez Catalina M.D.   On: 08/17/2017 08:43   US Renal  Result Date: 08/16/2017 CLINICAL DATA:  Hydronephrosis. EXAM: RENAL / URINARY TRACT ULTRASOUND COMPLETE COMPARISON:  CT abdomen pelvis dated August 12, 2017. FINDINGS: Right Kidney: Length: 9.3 cm. Echogenicity within normal limits. No mass or hydronephrosis visualized. Small simple cyst measuring 1.2 cm. Left Kidney: Length: 9.2 cm. Echogenicity within normal limits. Stent visualized in  the renal pelvis. Mild-to-moderate hydronephrosis, not significantly changed. No mass visualized. Bladder: There is layering, heterogeneously hypoechoic material in the posterior bladder. There is no internal vascularity. The bilateral ureteral jets are visualized. IMPRESSION: 1. Unchanged mild-to-moderate left hydronephrosis. Partially visualized stent in the left renal pelvis. 2. Layering, heterogeneously hypoechoic material in the posterior bladder without internal vascularity. While nonspecific, this may represent hematoma. Consider direct  visualization for further evaluation. Electronically Signed   By: Titus Dubin M.D.   On: 08/16/2017 12:08   Dg Chest Port 1 View  Result Date: 08/12/2017 CLINICAL DATA:  Fever.  Altered mental status. EXAM: PORTABLE CHEST 1 VIEW COMPARISON:  None FINDINGS: The heart size and mediastinal contours are within normal limits. Both lungs are clear. The visualized skeletal structures are unremarkable. IMPRESSION: No active disease. Electronically Signed   By: Kerby Moors M.D.   On: 08/12/2017 16:53   Dg Swallowing Func-speech Pathology  Result Date: 08/18/2017 Objective Swallowing Evaluation: Type of Study: MBS-Modified Barium Swallow Study  Patient Details Name: Seif Teichert MRN: [id number] Date of Birth: 06-24-1940 Today's Date: 08/18/2017 Time: SLP Start Time (ACUTE ONLY): 1135 -SLP Stop Time (ACUTE ONLY): 1150 SLP Time Calculation (min) (ACUTE ONLY): 15 min Past Medical History: Past Medical History: Diagnosis Date . CAD (coronary artery disease)  . CKD (chronic kidney disease), stage III ([place])   [facility] care everywhere notes (08/13/2017) . COPD (chronic obstructive pulmonary disease) ([place])   /spouse "not aware of this hx" (08/13/2017) . Foot drop, left foot  . Gout  . Headache   "weekly since 06/2015" (08/13/2017) . Hyperlipidemia  . Hypertension  . Myocardial infarction ([place])   /spouse "not aware of this hx" (08/13/2017) . Prostate cancer ([place]) 2007  S/P radiation and OR . Sciatic nerve pain, left  . Subdural hematoma ([place]) 06/12/2017  "no OR; hospitalized for 14 days" (08/13/2017) . Type II diabetes mellitus ([place])   /care everywhere notes from [facility] (08/13/2017);  "tx'd 06/2017 because when he had head trauma, it affected his blood sugar, not tx'd since" (08/13/2017) Past Surgical History: Past Surgical History: Procedure Laterality Date . CATARACT EXTRACTION W/ INTRAOCULAR LENS  IMPLANT, BILATERAL Bilateral 2017 . CORONARY ANGIOPLASTY WITH STENT PLACEMENT   . CYSTOSCOPY W/ URETERAL STENT  PLACEMENT Bilateral 08/16/2017  Procedure: CYSTOSCOPY WITH BILATERAL  RETROGRADE PYELOGRAM LEFT URETERAL STENT EXCHANGE;  Surgeon: Festus Aloe, MD;  Location: [place];  Service: Urology;  Laterality: Bilateral; . CYSTOSCOPY WITH FULGERATION  multiple  /care everywhere notes [facility] (08/13/2017) . PROSTATECTOMY  2007 . urinary stent  05/2017  [facility]; "has it changed q 3-6 months; last one was placed 05/2017; due to be changed 09/2017 (08/13/2017) HPI: Revin Corker is a 77 y.o. male with a past medical history significant for recent CVA, hypertension, CAD status post cardiac stent, type 2 diabetes, CKD-III, peripheral artery disease, ureteral stone, prostate cancer, sciatica, gout who presents today with altered mental status in the setting of urinary tract infection.   Subjective: patient awake sitting in chair in radiology suite, no attempts to speak, attention very poor Assessment / Plan / Recommendation CHL IP CLINICAL IMPRESSIONS 08/18/2017 Clinical Impression Patient exhibited a severe oropharyngeal dysphagia which is likely secondary to current cognitive impairment. Patient required maximal cues to initiate swallow of all consistencies and exhibited delays in swallow initiation to level of vallecular and pyriform sinuses. Full clearance of bolus from pharyngeal cavity was observed when swallow initiated and no residuals remained. Patient exhibited one instance of  aspiration of small amount of nectar liquids during the swallow, and patient did not cough.(silent aspiration). Patient likely will be able to return to diet when his cognitive status improves, but currently he is not safe for PO's (except meds in puree).  SLP Visit Diagnosis Dysphagia, oropharyngeal phase (R13.12) Attention and concentration deficit following -- Frontal lobe and executive function deficit following -- Impact on safety and function Severe aspiration risk   CHL IP TREATMENT RECOMMENDATION 08/18/2017 Treatment  Recommendations Therapy as outlined in treatment plan below   Prognosis 08/18/2017 Prognosis for Safe Diet Advancement Good Barriers to Reach Goals Cognitive deficits Barriers/Prognosis Comment -- CHL IP DIET RECOMMENDATION 08/18/2017 SLP Diet Recommendations NPO Liquid Administration via -- Medication Administration Other (Comment) Compensations -- Postural Changes Seated upright at 90 degrees   CHL IP OTHER RECOMMENDATIONS 08/18/2017 Recommended Consults -- Oral Care Recommendations Oral care BID Other Recommendations --   CHL IP FOLLOW UP RECOMMENDATIONS 08/18/2017 Follow up Recommendations Skilled Nursing facility   [facility] IP FREQUENCY AND DURATION 08/18/2017 Speech Therapy Frequency (ACUTE ONLY) min 2x/week Treatment Duration 2 weeks      CHL IP ORAL PHASE 08/18/2017 Oral Phase Impaired Oral - Pudding Teaspoon -- Oral - Pudding Cup -- Oral - Honey Teaspoon -- Oral - Honey Cup Weak lingual manipulation;Reduced posterior propulsion;Delayed oral transit;Holding of bolus Oral - Nectar Teaspoon -- Oral - Nectar Cup Weak lingual manipulation;Delayed oral transit;Holding of bolus;Reduced posterior propulsion Oral - Nectar Straw -- Oral - Thin Teaspoon -- Oral - Thin Cup -- Oral - Thin Straw -- Oral - Puree Weak lingual manipulation;Delayed oral transit;Holding of bolus;Reduced posterior propulsion Oral - Mech Soft -- Oral - Regular -- Oral - Multi-Consistency -- Oral - Pill -- Oral Phase - Comment Oral phase appears to be significantly impacted by cognitive status  CHL IP PHARYNGEAL PHASE 08/18/2017 Pharyngeal Phase Impaired Pharyngeal- Pudding Teaspoon -- Pharyngeal -- Pharyngeal- Pudding Cup -- Pharyngeal -- Pharyngeal- Honey Teaspoon -- Pharyngeal -- Pharyngeal- Honey Cup Delayed swallow initiation-pyriform sinuses;Delayed swallow initiation-vallecula;Other (Comment) Pharyngeal -- Pharyngeal- Nectar Teaspoon -- Pharyngeal -- Pharyngeal- Nectar Cup Penetration/Aspiration during swallow;Delayed swallow initiation-pyriform  sinuses;Delayed swallow initiation-vallecula;Reduced airway/laryngeal closure Pharyngeal Material enters airway, passes BELOW cords without attempt by patient to eject out (silent aspiration) Pharyngeal- Nectar Straw -- Pharyngeal -- Pharyngeal- Thin Teaspoon -- Pharyngeal -- Pharyngeal- Thin Cup -- Pharyngeal -- Pharyngeal- Thin Straw -- Pharyngeal -- Pharyngeal- Puree Delayed swallow initiation-vallecula;Delayed swallow initiation-pyriform sinuses Pharyngeal -- Pharyngeal- Mechanical Soft -- Pharyngeal -- Pharyngeal- Regular -- Pharyngeal -- Pharyngeal- Multi-consistency -- Pharyngeal -- Pharyngeal- Pill -- Pharyngeal -- Pharyngeal Comment Patient exhibited significant swallow initiation delays and oral holding  CHL IP CERVICAL ESOPHAGEAL PHASE 08/18/2017 Cervical Esophageal Phase WFL Pudding Teaspoon -- Pudding Cup -- Honey Teaspoon -- Honey Cup -- Nectar Teaspoon -- Nectar Cup -- Nectar Straw -- Thin Teaspoon -- Thin Cup -- Thin Straw -- Puree -- Mechanical Soft -- Regular -- Multi-consistency -- Pill -- Cervical Esophageal Comment -- Sonia Baller, MA, CCC-SLP 08/18/17 1:14 PM                Sela Hilding, MD 08/19/2017, 8:14 AM PGY-2, [place] Intern pager: [phone], text pages welcome

## 2017-08-20 ENCOUNTER — Other Ambulatory Visit (HOSPITAL_COMMUNITY): Payer: Medicare Other

## 2017-08-20 ENCOUNTER — Inpatient Hospital Stay (HOSPITAL_COMMUNITY)
Admit: 2017-08-20 | Discharge: 2017-08-20 | Disposition: A | Discharge status: Home / Self Care | Payer: Medicare Other | Attending: Family Medicine | Admitting: Family Medicine

## 2017-08-20 ENCOUNTER — Inpatient Hospital Stay (HOSPITAL_COMMUNITY): Payer: Medicare Other

## 2017-08-20 DIAGNOSIS — B179 Acute viral hepatitis, unspecified: Secondary | ICD-10-CM

## 2017-08-20 DIAGNOSIS — B390 Acute pulmonary histoplasmosis capsulati: Secondary | ICD-10-CM

## 2017-08-20 DIAGNOSIS — G9340 Encephalopathy, unspecified: Secondary | ICD-10-CM

## 2017-08-20 DIAGNOSIS — E440 Moderate protein-calorie malnutrition: Secondary | ICD-10-CM

## 2017-08-20 LAB — CBC
HCT: 30.8 % — ABNORMAL LOW (ref 39.0–52.0)
HEMOGLOBIN: 9.8 g/dL — AB (ref 13.0–17.0)
MCH: 28.8 pg (ref 26.0–34.0)
MCHC: 31.8 g/dL (ref 30.0–36.0)
MCV: 90.6 fL (ref 78.0–100.0)
Platelets: 227 10*3/uL (ref 150–400)
RBC: 3.4 MIL/uL — AB (ref 4.22–5.81)
RDW: 15.1 % (ref 11.5–15.5)
WBC: 11.5 10*3/uL — ABNORMAL HIGH (ref 4.0–10.5)

## 2017-08-20 LAB — BLOOD GAS, ARTERIAL
Acid-base deficit: 8.3 mmol/L — ABNORMAL HIGH (ref 0.0–2.0)
Bicarbonate: 15 mmol/L — ABNORMAL LOW (ref 20.0–28.0)
Drawn by: 27553
O2 Saturation: 97 %
PCO2 ART: 21.8 mmHg — AB (ref 32.0–48.0)
PH ART: 7.451 — AB (ref 7.350–7.450)
PO2 ART: 93.7 mmHg (ref 83.0–108.0)
Patient temperature: 98.6

## 2017-08-20 LAB — BASIC METABOLIC PANEL
Anion gap: 10 (ref 5–15)
BUN: 31 mg/dL — ABNORMAL HIGH (ref 6–20)
CHLORIDE: 112 mmol/L — AB (ref 101–111)
CO2: 15 mmol/L — ABNORMAL LOW (ref 22–32)
Calcium: 7.7 mg/dL — ABNORMAL LOW (ref 8.9–10.3)
Creatinine, Ser: 1.35 mg/dL — ABNORMAL HIGH (ref 0.61–1.24)
GFR calc Af Amer: 56 mL/min — ABNORMAL LOW (ref >60)
GFR calc non Af Amer: 48 mL/min — ABNORMAL LOW (ref >60)
Glucose, Bld: 129 mg/dL — ABNORMAL HIGH (ref 65–99)
POTASSIUM: 3.8 mmol/L (ref 3.5–5.1)
SODIUM: 137 mmol/L (ref 135–145)

## 2017-08-20 LAB — VITAMIN B12: VITAMIN B 12: 424 pg/mL (ref 180–914)

## 2017-08-20 LAB — GLUCOSE, CAPILLARY
GLUCOSE-CAPILLARY: 129 mg/dL — AB (ref 65–99)
Glucose-Capillary: 126 mg/dL — ABNORMAL HIGH (ref 65–99)
Glucose-Capillary: 131 mg/dL — ABNORMAL HIGH (ref 65–99)

## 2017-08-20 LAB — RPR: RPR Ser Ql: NONREACTIVE

## 2017-08-20 LAB — FOLATE: FOLATE: 7.5 ng/mL (ref >5.9)

## 2017-08-20 LAB — HIV ANTIBODY (ROUTINE TESTING W REFLEX): HIV Screen 4th Generation wRfx: NONREACTIVE

## 2017-08-20 LAB — TSH: TSH: 2.276 u[IU]/mL (ref 0.350–4.500)

## 2017-08-20 MED ORDER — ENALAPRILAT 1.25 MG/ML IV SOLN
0.6250 mg | Freq: Four times a day (QID) | INTRAVENOUS | Status: DC
  Administered 2017-08-20: 0.625 mg via INTRAVENOUS
  Filled 2017-08-20 (×2): qty 0.5

## 2017-08-20 MED ORDER — JEVITY 1.2 CAL PO LIQD
1000.0000 mL | ORAL | Status: DC
  Administered 2017-08-20: 1000 mL
  Filled 2017-08-20 (×4): qty 1000

## 2017-08-20 MED ORDER — ENALAPRILAT 1.25 MG/ML IV SOLN
1.2500 mg | Freq: Four times a day (QID) | INTRAVENOUS | Status: DC
  Administered 2017-08-20 – 2017-08-21 (×3): 1.25 mg via INTRAVENOUS
  Filled 2017-08-20 (×4): qty 1

## 2017-08-20 MED ORDER — CLONIDINE HCL 0.2 MG/24HR TD PTWK
0.2000 mg | MEDICATED_PATCH | TRANSDERMAL | Status: DC
  Administered 2017-08-20: 0.2 mg via TRANSDERMAL
  Filled 2017-08-20: qty 1

## 2017-08-20 MED ORDER — LISINOPRIL 40 MG PO TABS: 40.0000 mg | ORAL_TABLET | Freq: Every day | ORAL | Status: DC

## 2017-08-20 MED ORDER — JEVITY 1.2 CAL PO LIQD
1000.0000 mL | ORAL | Status: DC
  Filled 2017-08-20 (×2): qty 1000

## 2017-08-20 NOTE — Progress Notes (Signed)
Spoke with Ms. Raye, patient's wife, on the phone today to update her on Mr. Lenhoff worsening altered mental status despite appropriate antibiotic treatment of his UTI.  Also informed her about the CT head results and the neurology and neurosurgeon's recommendations that a possible small new bleed is not likely the source of his AMS and that he would not be a surgical candidate.  Told her that his EEG showed no seizure activity but diffuse slowing.  She is concerned about his condition and thinks he will likely not improve, but she wants to give him about 48 more hours before she deescalates his care.  She would like to start tube feeding today if possible.  She would also like to get palliative care on board to discuss goals of care.  She appreciates being informed and would like to be updated daily.  Amanda C. Shan Levans, MD PGY-1, [facility] 08/20/2017 11:14 AM

## 2017-08-20 NOTE — Progress Notes (Signed)
EEG completed; results pending.    

## 2017-08-20 NOTE — Progress Notes (Signed)
Family Medicine Teaching Service Daily Progress Note Intern Pager: [phone]  Patient name: Micheal Fox Medical record number: [id number] Date of birth: [birth year] Age: 77 y.o. Gender: male  Primary Care Provider: Raelene Bott, MD Consultants: Urology Code Status: full  Pt Overview and Major Events to Date:  Admitted 6/16 Started cefepime and vancomycin; stopped vancomycin 6/17 Switched abx to Keflex 6/18 Escalated abx to ceftriaxone 6/19 Increased ceftriaxone dose 6/20 Ureteral stent exchanged on 6/20 AMS improved 6/21 6/22 - CT head repeated with motion artifact, ?trace [facility]  6/23-24 - worsening mental status  Assessment and Plan:  Micheal Fox is a 77 y.o. male with a past medical history significant for hypertension, CAD status post cardiac stent, type 2 diabetes, CKD-III, peripheral artery disease, ureteral stone, prostate cancer, sciatica, gout who presents with altered mental status in the setting of urinary tract infection.    Altered mental status, acute, persistent Likely due to infection, although he does have deficits at baseline due to previous [place].  Question delerium or sundowning, minimally verbal this am after awakening. 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 Expanded workup on 6/23, including EEG and ammonia. Ammonia elevated, although appears noncirrhotic hyperammonemia, will treat with lactulose.  --NPO due to AMS --Acetaminophen 650 mg every 6 as needed - foley in place, urine appears clear - modified barium swallow study per SLP request - f/u EEG result - talk with family regarding tube feeds, proceeding with palliative care (wife was agreeable to palliative care on 6/23)  Pyelonephritis Last fever 0900 6/21, WBC down trended from 15.1 on 6/23 to 11.5 on 6/24.  - continue ceftriaxone 2 g daily  Respiratory alkalosis with metabolic  compensation Bicarbonate has been low during admission.  ABG on 6/24 with pH 7.45, CO2 21.8, Bicarb 15, O2 93, acid/base deficit 8.3. - likely not leading to AMS since not hypercapnic or hypoxic  Hypernatremia, resolved Sodium 137 on 6/24 - stop 1/2NS and start NS at 125 ml/hr  AKI on CKD3, improving On admission patient's creatinine is 2.45, is 1.35 on 6/24.  Baseline appears to be around 1.7-1.8 based on [facility] record.  Cr at baseline today. --NS @ 125 ml/hr  Hypertension Patient's blood pressure has been high during admission, last reading 185/100.  Patient is on lisinopril, amlodipine, metoprolol at home.   - lisinopril stopped on 6/20 given patient's renal function, restart today at 40 mg daily - increase Coreg to 25 mg BID --continue amlodipine - clonidine added 6/22, increase to 0.2mg  6/24 - add enalapril 0.625 mg IV Q6H --PRN IV metoprolol 5 mg for SBP >160, DPB>110  Hyperlipidemia Patient is currently on Pravachol 40 mg.  We will continue home regimen.  Last lipid panel showed HDL 36, triglyceride 108, LDL 113, total cholesterol 170 -continue pravastatin  CAD status post stenting 2004 Patient has history of stent for which he is on Plavix and aspirin.  Plavix was recently held in the setting of recent subarachnoid hemorrhage while at [facility].  Patient told to resume Plavix on 6/12 after discussing with PCP.  Patient was maintained on aspirin therapy.  It appears that patient has not resume Plavix while at the SNF.  Troponin has been negative.  Recent stress test May 2019 at [facility] showed normal perfusion studies there is no evidence of ischemia and EF of 65%.  Type 2 diabetes Most recent A1c 7.2.  Likely will not need glycemic control while in hospital given  age and A1c. - monitor on daily BMP  PAD Arterial duplex on 4/8 2019 show evidence of arterial obstruction involving the SFA and or popliteal artery and tibioperoneal vessels.  Sciatica, chronic  Patient has been taking  gabapentin 300 mg 3 times daily as well as tramadol for pain control.  Patient will also on Flexeril.  Continue to monitor symptoms.  Prostate cancer s/p radical prostatectomy in 2007 Followed by urology at [facility]. --Continue tamsulosin 0.4 mg daily  Gout --Continue allopurinol   Tobacco use Two cigarettes daily  FEN/GI: NPO PPx: SCDs  Disposition: SNF  Subjective:  Patient lightly squeezed hand on command, shakes head no to pain, nonverbal.  Objective: Temp:  [98.8 F (37.1 C)-99.6 F (37.6 C)] 99.6 F (37.6 C) (06/24 0345) Pulse Rate:  [110-127] 121 (06/24 0345) Resp:  [18-26] 20 (06/24 0345) BP: (102-188)/(57-108) 185/100 (06/24 0345) SpO2:  [99 %-100 %] 99 % (06/24 0345) Weight:  [143 lb 4.8 oz (65 kg)] 143 lb 4.8 oz (65 kg) (06/23 2128) Physical Exam: General: thin, elderly man lying in bed, squeezed my hand on command, having facial twitching Cardiovascular: tachycardic, regular rhythm Respiratory: CTAB Abdomen: soft, nontender, nondistended Extremities: thin, no edema, warm  Laboratory: Recent Labs  Lab 08/18/17 0834 08/19/17 0803 08/20/17 0509  WBC 17.3* 15.1* 11.5*  HGB 10.7* 10.8* 9.8*  HCT 33.2* 33.0* 30.8*  PLT 222 229 227   Recent Labs  Lab 08/18/17 0834 08/19/17 0803 08/20/17 0509  NA 145 144 137  K 4.9 4.8 3.8  CL 122* 118* 112*  CO2 13* 15* 15*  BUN 45* 37* 31*  CREATININE 1.53* 1.48* 1.35*  CALCIUM 8.8* 8.8* 7.7*  GLUCOSE 185* 164* 129*    Imaging/Diagnostic Tests: Ct Abdomen Pelvis Wo Contrast  Result Date: 08/12/2017 CLINICAL DATA:  pyelonephritis EXAM: CT ABDOMEN AND PELVIS WITHOUT CONTRAST TECHNIQUE: Multidetector CT imaging of the abdomen and pelvis was performed following the standard protocol without IV contrast. COMPARISON:  CT 05/19/2015 FINDINGS: Lower chest: Lung bases are clear. Hepatobiliary: Central hepatic cysts. Gallbladder normal. No biliary duct dilatation. No IV contrast Pancreas: Pancreas is normal. No ductal  dilatation. No pancreatic inflammation. Spleen: Normal spleen Adrenals/urinary tract: Adrenal glands normal. Ureteral stent within the LEFT renal pelvis extending to the bladder. Mild hydronephrosis and hydroureter on the LEFT. RIGHT ureter mildly dilated to lesser degree. Bladder normal Stomach/Bowel: Stomach, small bowel, appendix, and cecum are normal. The colon and rectosigmoid colon are normal. Vascular/Lymphatic: Abdominal aorta is normal caliber with atherosclerotic calcification. There is no retroperitoneal or periportal lymphadenopathy. No pelvic lymphadenopathy. Reproductive: Prostate poorly defined. There calcifications and vascular clips prostate bed Other: No free fluid. Musculoskeletal: No aggressive osseous lesion. IMPRESSION: 1. Hydronephrosis and hydroureter on the LEFT with ureteral stent in place. 2. Minimal hydroureter on the RIGHT. 3. Bladder normal. 4. Postsurgical change in the prostate bed. Electronically Signed   By: Suzy Bouchard M.D.   On: 08/12/2017 22:09   Dg Chest 1 View  Result Date: 08/15/2017 CLINICAL DATA:  Altered mental status.  Acute kidney injury. EXAM: CHEST  1 VIEW COMPARISON:  Single-view of the chest 08/12/2017. FINDINGS: Lungs are clear. Heart size is normal. No pneumothorax or pleural effusion. No acute bony abnormality. IMPRESSION: No acute disease. Electronically Signed   By: Inge Rise M.D.   On: 08/15/2017 11:35   Ct Head Wo Contrast  Result Date: 08/18/2017 CLINICAL DATA:  Altered mental status. Subarachnoid hemorrhage last month. EXAM: CT HEAD WITHOUT CONTRAST TECHNIQUE: Contiguous axial images were obtained  from the base of the skull through the vertex without intravenous contrast. COMPARISON:  08/12/2017 FINDINGS: Brain: There is mild motion artifact. Trace subarachnoid hemorrhage is questioned posteriorly in the right sylvian fissure and over the lateral aspect of the right cerebellar hemisphere, however either or both of these may be artifactual.  A tiny amount of subarachnoid blood is also not excluded posteriorly in the left sylvian fissure. No extra-axial fluid collection, acute large territory infarct, or midline shift is present. Mild-to-moderate ventriculomegaly is unchanged with temporal horn dilatation which may reflect a component of communicating hydrocephalus related to last month's subarachnoid hemorrhage. Subcortical and periventricular white matter hypodensities are unchanged and nonspecific though may reflect moderate chronic small vessel ischemic disease and/or transependymal CSF flow. Vascular: Calcified atherosclerosis at the skull base. No hyperdense vessel. Skull: No fracture or focal osseous lesion. Sinuses/Orbits: Persistent left frontal sinus and partial anterior left ethmoid air cell opacification. Clear mastoid air cells. Bilateral cataract extraction. Other: None. IMPRESSION: 1. Trace subarachnoid hemorrhage versus artifact in the right greater than left sylvian fissures and right posterior fossa. 2. No extra-axial fluid collection or evidence of acute infarct. 3. Unchanged ventriculomegaly and periventricular white matter hypoattenuation as described above. These results will be called to the ordering clinician or representative by the Radiologist Assistant, and communication documented in the PACS or zVision Dashboard. Electronically Signed   By: Logan Bores M.D.   On: 08/18/2017 15:08   Ct Head Wo Contrast  Result Date: 08/12/2017 CLINICAL DATA:  Altered level of consciousness. EXAM: CT HEAD WITHOUT CONTRAST TECHNIQUE: Contiguous axial images were obtained from the base of the skull through the vertex without intravenous contrast. COMPARISON:  None FINDINGS: Brain: No evidence of acute infarction, hemorrhage, hydrocephalus, extra-axial collection or mass lesion/mass effect. Focal area of low attenuation within the proximal cervical cord may represent a syrinx. There is mild diffuse low-attenuation within the subcortical and  periventricular white matter compatible with chronic microvascular disease. Prominence of sulci and ventricles identified compatible with brain atrophy. Vascular: No hyperdense vessel or unexpected calcification. Skull: Normal. Negative for fracture or focal lesion. Sinuses/Orbits: No acute finding. Other: None IMPRESSION: 1. No acute intracranial abnormalities. 2. Chronic small vessel ischemic change and brain atrophy. Electronically Signed   By: Kerby Moors M.D.   On: 08/12/2017 17:09   Dg Cystogram  Result Date: 08/17/2017 CLINICAL DATA:  Left ureteral stent exchange EXAM: RETROGRADE PYELOGRAM COMPARISON:  08/12/2017 FINDINGS: Initial images demonstrate injection in the left ureter. The ureter is again dilated as is the collecting system. Left ureteral stent was then placed in satisfactory position. Distal left ureteral narrowing is noted just above the UVJ. Distal visualization of the right ureter shows no focal abnormality. A Foley catheter is seen within the bladder. IMPRESSION: Left ureteral stent exchange. Distal left ureteral narrowing. Electronically Signed   By: Inez Catalina M.D.   On: 08/17/2017 08:43   US Renal  Result Date: 08/16/2017 CLINICAL DATA:  Hydronephrosis. EXAM: RENAL / URINARY TRACT ULTRASOUND COMPLETE COMPARISON:  CT abdomen pelvis dated August 12, 2017. FINDINGS: Right Kidney: Length: 9.3 cm. Echogenicity within normal limits. No mass or hydronephrosis visualized. Small simple cyst measuring 1.2 cm. Left Kidney: Length: 9.2 cm. Echogenicity within normal limits. Stent visualized in the renal pelvis. Mild-to-moderate hydronephrosis, not significantly changed. No mass visualized. Bladder: There is layering, heterogeneously hypoechoic material in the posterior bladder. There is no internal vascularity. The bilateral ureteral jets are visualized. IMPRESSION: 1. Unchanged mild-to-moderate left hydronephrosis. Partially visualized stent in the left  renal pelvis. 2. Layering,  heterogeneously hypoechoic material in the posterior bladder without internal vascularity. While nonspecific, this may represent hematoma. Consider direct visualization for further evaluation. Electronically Signed   By: Titus Dubin M.D.   On: 08/16/2017 12:08   Dg Chest Port 1 View  Result Date: 08/12/2017 CLINICAL DATA:  Fever.  Altered mental status. EXAM: PORTABLE CHEST 1 VIEW COMPARISON:  None FINDINGS: The heart size and mediastinal contours are within normal limits. Both lungs are clear. The visualized skeletal structures are unremarkable. IMPRESSION: No active disease. Electronically Signed   By: Kerby Moors M.D.   On: 08/12/2017 16:53   Dg Swallowing Func-speech Pathology  Result Date: 08/18/2017 Objective Swallowing Evaluation: Type of Study: MBS-Modified Barium Swallow Study  Patient Details Name: Maxten Shuler MRN: [id number] Date of Birth: Oct 08, 1940 Today's Date: 08/18/2017 Time: SLP Start Time (ACUTE ONLY): 1135 -SLP Stop Time (ACUTE ONLY): 1150 SLP Time Calculation (min) (ACUTE ONLY): 15 min Past Medical History: Past Medical History: Diagnosis Date . CAD (coronary artery disease)  . CKD (chronic kidney disease), stage III ([place])   [facility] care everywhere notes (08/13/2017) . COPD (chronic obstructive pulmonary disease) ([place])   /spouse "not aware of this hx" (08/13/2017) . Foot drop, left foot  . Gout  . Headache   "weekly since 06/2015" (08/13/2017) . Hyperlipidemia  . Hypertension  . Myocardial infarction ([place])   /spouse "not aware of this hx" (08/13/2017) . Prostate cancer ([place]) 2007  S/P radiation and OR . Sciatic nerve pain, left  . Subdural hematoma ([place]) 06/12/2017  "no OR; hospitalized for 14 days" (08/13/2017) . Type II diabetes mellitus ([place])   /care everywhere notes from [facility] (08/13/2017);  "tx'd 06/2017 because when he had head trauma, it affected his blood sugar, not tx'd since" (08/13/2017) Past Surgical History: Past Surgical History: Procedure Laterality Date .  CATARACT EXTRACTION W/ INTRAOCULAR LENS  IMPLANT, BILATERAL Bilateral 2017 . CORONARY ANGIOPLASTY WITH STENT PLACEMENT   . CYSTOSCOPY W/ URETERAL STENT PLACEMENT Bilateral 08/16/2017  Procedure: CYSTOSCOPY WITH BILATERAL  RETROGRADE PYELOGRAM LEFT URETERAL STENT EXCHANGE;  Surgeon: Festus Aloe, MD;  Location: [place];  Service: Urology;  Laterality: Bilateral; . CYSTOSCOPY WITH FULGERATION  multiple  /care everywhere notes [facility] (08/13/2017) . PROSTATECTOMY  2007 . urinary stent  05/2017  [facility]; "has it changed q 3-6 months; last one was placed 05/2017; due to be changed 09/2017 (08/13/2017) HPI: Coleman Kalas is a 77 y.o. male with a past medical history significant for recent CVA, hypertension, CAD status post cardiac stent, type 2 diabetes, CKD-III, peripheral artery disease, ureteral stone, prostate cancer, sciatica, gout who presents today with altered mental status in the setting of urinary tract infection.   Subjective: patient awake sitting in chair in radiology suite, no attempts to speak, attention very poor Assessment / Plan / Recommendation CHL IP CLINICAL IMPRESSIONS 08/18/2017 Clinical Impression Patient exhibited a severe oropharyngeal dysphagia which is likely secondary to current cognitive impairment. Patient required maximal cues to initiate swallow of all consistencies and exhibited delays in swallow initiation to level of vallecular and pyriform sinuses. Full clearance of bolus from pharyngeal cavity was observed when swallow initiated and no residuals remained. Patient exhibited one instance of aspiration of small amount of nectar liquids during the swallow, and patient did not cough.(silent aspiration). Patient likely will be able to return to diet when his cognitive status improves, but currently he is not safe for PO's (except meds in puree).  SLP Visit  Diagnosis Dysphagia, oropharyngeal phase (R13.12) Attention and concentration deficit following -- Frontal lobe and executive  function deficit following -- Impact on safety and function Severe aspiration risk   CHL IP TREATMENT RECOMMENDATION 08/18/2017 Treatment Recommendations Therapy as outlined in treatment plan below   Prognosis 08/18/2017 Prognosis for Safe Diet Advancement Good Barriers to Reach Goals Cognitive deficits Barriers/Prognosis Comment -- CHL IP DIET RECOMMENDATION 08/18/2017 SLP Diet Recommendations NPO Liquid Administration via -- Medication Administration Other (Comment) Compensations -- Postural Changes Seated upright at 90 degrees   CHL IP OTHER RECOMMENDATIONS 08/18/2017 Recommended Consults -- Oral Care Recommendations Oral care BID Other Recommendations --   CHL IP FOLLOW UP RECOMMENDATIONS 08/18/2017 Follow up Recommendations Skilled Nursing facility   [facility] IP FREQUENCY AND DURATION 08/18/2017 Speech Therapy Frequency (ACUTE ONLY) min 2x/week Treatment Duration 2 weeks      CHL IP ORAL PHASE 08/18/2017 Oral Phase Impaired Oral - Pudding Teaspoon -- Oral - Pudding Cup -- Oral - Honey Teaspoon -- Oral - Honey Cup Weak lingual manipulation;Reduced posterior propulsion;Delayed oral transit;Holding of bolus Oral - Nectar Teaspoon -- Oral - Nectar Cup Weak lingual manipulation;Delayed oral transit;Holding of bolus;Reduced posterior propulsion Oral - Nectar Straw -- Oral - Thin Teaspoon -- Oral - Thin Cup -- Oral - Thin Straw -- Oral - Puree Weak lingual manipulation;Delayed oral transit;Holding of bolus;Reduced posterior propulsion Oral - Mech Soft -- Oral - Regular -- Oral - Multi-Consistency -- Oral - Pill -- Oral Phase - Comment Oral phase appears to be significantly impacted by cognitive status  CHL IP PHARYNGEAL PHASE 08/18/2017 Pharyngeal Phase Impaired Pharyngeal- Pudding Teaspoon -- Pharyngeal -- Pharyngeal- Pudding Cup -- Pharyngeal -- Pharyngeal- Honey Teaspoon -- Pharyngeal -- Pharyngeal- Honey Cup Delayed swallow initiation-pyriform sinuses;Delayed swallow initiation-vallecula;Other (Comment) Pharyngeal --  Pharyngeal- Nectar Teaspoon -- Pharyngeal -- Pharyngeal- Nectar Cup Penetration/Aspiration during swallow;Delayed swallow initiation-pyriform sinuses;Delayed swallow initiation-vallecula;Reduced airway/laryngeal closure Pharyngeal Material enters airway, passes BELOW cords without attempt by patient to eject out (silent aspiration) Pharyngeal- Nectar Straw -- Pharyngeal -- Pharyngeal- Thin Teaspoon -- Pharyngeal -- Pharyngeal- Thin Cup -- Pharyngeal -- Pharyngeal- Thin Straw -- Pharyngeal -- Pharyngeal- Puree Delayed swallow initiation-vallecula;Delayed swallow initiation-pyriform sinuses Pharyngeal -- Pharyngeal- Mechanical Soft -- Pharyngeal -- Pharyngeal- Regular -- Pharyngeal -- Pharyngeal- Multi-consistency -- Pharyngeal -- Pharyngeal- Pill -- Pharyngeal -- Pharyngeal Comment Patient exhibited significant swallow initiation delays and oral holding  CHL IP CERVICAL ESOPHAGEAL PHASE 08/18/2017 Cervical Esophageal Phase WFL Pudding Teaspoon -- Pudding Cup -- Honey Teaspoon -- Honey Cup -- Nectar Teaspoon -- Nectar Cup -- Nectar Straw -- Thin Teaspoon -- Thin Cup -- Thin Straw -- Puree -- Mechanical Soft -- Regular -- Multi-consistency -- Pill -- Cervical Esophageal Comment -- Sonia Baller, MA, CCC-SLP 08/18/17 1:14 PM                Kathrene Alu, MD 08/20/2017, 6:22 AM PGY-1, [place] Intern pager: [phone], text pages welcome

## 2017-08-20 NOTE — Progress Notes (Addendum)
Cortrak team on [place] paged.

## 2017-08-20 NOTE — Progress Notes (Signed)
Nutrition Follow-up  DOCUMENTATION CODES:   Non-severe (moderate) malnutrition in context of chronic illness  INTERVENTION:   Tube Feeding:  Jevity 1.2 @ 60 ml/hr Provides 80 g of protein, 1728 kcals, 1166 mL of free water  NUTRITION DIAGNOSIS:   Moderate Malnutrition related to chronic illness as evidenced by mild fat depletion, mild muscle depletion.  Being addressed via TF  GOAL:   Patient will meet greater than or equal to 90% of their needs  Being met via TF  MONITOR:   PO intake, Supplement acceptance, Labs, Weight trends  REASON FOR ASSESSMENT:   Malnutrition Screening Tool    ASSESSMENT:   77 yo male admitted with acute worsening AMS in setting of UTI, AKI on CKD III. Pt with hx of HTN, CAD, DM, CKD III, PAD, prostate cancer, COPD, subdural hematoma   6/24 EEG today  Worsening mental status with unknown etiology, no evidence of severe increased cranial pressure or lesion, metabolic or drug cause  Pt remains NPO since 6/22, SLP following, Cortrak tube to be placed today with initiation of TF Noted palliative care has been consulted  Labs: BUN 31, Creatinine 1.35 Meds: reviewed  Diet Order:   Diet Order           Diet NPO time specified  Diet effective now          EDUCATION NEEDS:   Not appropriate for education at this time  Skin:  Skin Assessment: Skin Integrity Issues: Skin Integrity Issues:: Stage II Stage II: heel  Last BM:  6/23  Height:   Ht Readings from Last 1 Encounters:  08/16/17 '5\' 8"'$  (1.727 m)    Weight:   Wt Readings from Last 1 Encounters:  08/19/17 143 lb 4.8 oz (65 kg)    Ideal Body Weight:     BMI:  Body mass index is 21.79 kg/m.  Estimated Nutritional Needs:   Kcal:  1600-`1800 kcals   Protein:  70-80 g  Fluid:  >/= 1.6 L   Kerman Passey MS, RD, LDN, CNSC [phone] Pager  [phone] Weekend/On-Call Pager

## 2017-08-20 NOTE — Procedures (Signed)
HPI:  77 y/o with MS change  TECHNICAL SUMMARY:  A multichannel referential and bipolar montage EEG using the standard international 10-20 system was performed on the patient described as awake.  The dominant background activity consists of 6 hertz activity seen most prominantly over the posterior head region.  Much 4-5 hertz activity is seen intermixed.   Low voltage fast (beta) activity is distributed symmetrically and maximally over the anterior head regions.  ACTIVATION:  Stepwise photic stimulation and hyperventilation were not performed  EPILEPTIFORM ACTIVITY:  There were no spikes, sharp waves or paroxysmal activity.  SLEEP:  No sleep is noted   IMPRESSION:  This is an abnormal EEG demonstrating a mild to moderate diffuse slowing of electrocerebral activity.  This can be seen in a wide variety of encephalopathic state including those of a toxic, metabolic, or degenerative nature.  There were no focal, hemispheric, or lateralizing features.  No epileptiform activity was recorded.

## 2017-08-20 NOTE — Progress Notes (Addendum)
  Speech Language Pathology Treatment: Dysphagia  Patient Details Name: Micheal Fox MRN: [id number] DOB: 09-Jul-1940 Today's Date: 08/20/2017 Time: 4098-1191 SLP Time Calculation (min) (ACUTE ONLY): 20 min  Assessment / Plan / Recommendation Clinical Impression  Dysphagia treatment provided to check for PO readiness. Unfortunately pt continues to have AMS, unable to follow commands except opening mouth with max cues. He did make occasional attempts to vocalize in response to yes/ no questions and noted wet vocal quality; unable to cough or clear throat on command. Pt with oral holding requiring suction for both puree bolus and teaspoon sip of thin liquid; pt did appear to trigger pharyngeal swallow x1 with puree bolus; however it appeared to be gathered at base of tongue. Recommend that pt continue to be strictly NPO with meds via alternative means and frequent oral care. Noted palliative consult and plan for tube feeding. Will continue to follow for PO readiness which will be dependent on improvement in pt's cognition.   HPI HPI: Micheal Fox is a 77 y.o. male with a past medical history significant for recent CVA, hypertension, CAD status post cardiac stent, type 2 diabetes, CKD-III, peripheral artery disease, ureteral stone, prostate cancer, sciatica, gout who presents today with altered mental status in the setting of urinary tract infection.    Most recent head CT is showing trace SAH vs artifact right greater then left sylvian fissures and right posterior fossa.        SLP Plan  Continue with current plan of care       Recommendations  Diet recommendations: NPO Medication Administration: Via alternative means                Oral Care Recommendations: Oral care QID Follow up Recommendations: Skilled Nursing facility SLP Visit Diagnosis: Dysphagia, oropharyngeal phase (R13.12) Plan: Continue with current plan of care       [place], [place],  Home 08/20/2017, 11:21 AM  [id number]

## 2017-08-20 NOTE — Progress Notes (Signed)
Cortrak Tube Team Note:  Consult received to place a Cortrak feeding tube.   A 10 F Cortrak tube was placed in the L nare and secured with a nasal bridle at 74 cm. Per the Cortrak monitor reading the tube tip is post-pyloric.   No x-ray is required. RN may begin using tube.   If the tube becomes dislodged please keep the tube and contact the Cortrak team at [email or web address] (password [email or web address]) for replacement.  If after hours and replacement cannot be delayed, place a NG tube and confirm placement with an abdominal x-ray.      Jarome Matin, MS, RD, LDN, [facility] Inpatient Clinical Dietitian Pager # [phone] After hours/weekend pager # [phone]

## 2017-08-21 DIAGNOSIS — Z515 Encounter for palliative care: Secondary | ICD-10-CM

## 2017-08-21 DIAGNOSIS — R627 Adult failure to thrive: Secondary | ICD-10-CM

## 2017-08-21 DIAGNOSIS — Z66 Do not resuscitate: Secondary | ICD-10-CM

## 2017-08-21 LAB — CBC
HCT: 30.8 % — ABNORMAL LOW (ref 39.0–52.0)
HEMOGLOBIN: 9.8 g/dL — AB (ref 13.0–17.0)
MCH: 28.9 pg (ref 26.0–34.0)
MCHC: 31.8 g/dL (ref 30.0–36.0)
MCV: 90.9 fL (ref 78.0–100.0)
Platelets: 247 10*3/uL (ref 150–400)
RBC: 3.39 MIL/uL — AB (ref 4.22–5.81)
RDW: 15.3 % (ref 11.5–15.5)
WBC: 16.6 10*3/uL — ABNORMAL HIGH (ref 4.0–10.5)

## 2017-08-21 LAB — BASIC METABOLIC PANEL
ANION GAP: 9 (ref 5–15)
BUN: 32 mg/dL — AB (ref 8–23)
CO2: 18 mmol/L — ABNORMAL LOW (ref 22–32)
Calcium: 8.6 mg/dL — ABNORMAL LOW (ref 8.9–10.3)
Chloride: 121 mmol/L — ABNORMAL HIGH (ref 98–111)
Creatinine, Ser: 1.45 mg/dL — ABNORMAL HIGH (ref 0.61–1.24)
GFR calc Af Amer: 51 mL/min — ABNORMAL LOW (ref >60)
GFR calc non Af Amer: 44 mL/min — ABNORMAL LOW (ref >60)
GLUCOSE: 225 mg/dL — AB (ref 70–99)
POTASSIUM: 4 mmol/L (ref 3.5–5.1)
SODIUM: 148 mmol/L — AB (ref 135–145)

## 2017-08-21 LAB — GLUCOSE, CAPILLARY
GLUCOSE-CAPILLARY: 139 mg/dL — AB (ref 70–99)
Glucose-Capillary: 145 mg/dL — ABNORMAL HIGH (ref 70–99)
Glucose-Capillary: 190 mg/dL — ABNORMAL HIGH (ref 70–99)

## 2017-08-21 LAB — MAGNESIUM: Magnesium: 2.3 mg/dL (ref 1.7–2.4)

## 2017-08-21 LAB — PHOSPHORUS: PHOSPHORUS: 3 mg/dL (ref 2.5–4.6)

## 2017-08-21 MED ORDER — HYDROCHLOROTHIAZIDE 12.5 MG PO CAPS
12.5000 mg | ORAL_CAPSULE | Freq: Two times a day (BID) | ORAL | Status: DC
  Administered 2017-08-21: 12.5 mg via ORAL
  Filled 2017-08-21: qty 1

## 2017-08-21 MED ORDER — LORAZEPAM 1 MG PO TABS: 1.0000 mg | ORAL_TABLET | ORAL | Status: DC | PRN

## 2017-08-21 MED ORDER — HYDROCHLOROTHIAZIDE 25 MG PO TABS
12.5000 mg | ORAL_TABLET | Freq: Two times a day (BID) | ORAL | Status: DC
  Filled 2017-08-21: qty 1

## 2017-08-21 MED ORDER — HYDROCHLOROTHIAZIDE 12.5 MG PO CAPS: 12.5000 mg | ORAL_CAPSULE | Freq: Two times a day (BID) | ORAL | Status: DC

## 2017-08-21 MED ORDER — AMLODIPINE BESYLATE 10 MG PO TABS
10.0000 mg | ORAL_TABLET | Freq: Every day | ORAL | Status: DC
  Administered 2017-08-21: 10 mg via NASOGASTRIC
  Filled 2017-08-21: qty 1

## 2017-08-21 MED ORDER — MORPHINE SULFATE (CONCENTRATE) 10 MG/0.5ML PO SOLN: 5.0000 mg | ORAL | Status: DC | PRN

## 2017-08-21 MED ORDER — HYDROCHLOROTHIAZIDE 12.5 MG PO CAPS: 12.5000 mg | ORAL_CAPSULE | Freq: Every day | ORAL | Status: DC

## 2017-08-21 MED ORDER — CARVEDILOL 25 MG PO TABS
25.0000 mg | ORAL_TABLET | Freq: Two times a day (BID) | ORAL | Status: DC
  Administered 2017-08-21: 25 mg via NASOGASTRIC
  Filled 2017-08-21: qty 1

## 2017-08-21 MED ORDER — CLONIDINE HCL 0.3 MG/24HR TD PTWK: 0.3000 mg | MEDICATED_PATCH | TRANSDERMAL | Status: DC

## 2017-08-21 NOTE — Consult Note (Signed)
Consultation Note Date: 08/21/2017   Patient Name: Micheal Fox  DOB: 03-22-1940  MRN: [id number]  Age / Sex: 77 y.o., male  PCP: Raelene Bott, MD Referring Physician: Dickie La, MD  Reason for Consultation: Establishing goals of care and Psychosocial/spiritual support  HPI/Patient Profile: 77 y.o. male   admitted on 08/12/2017 witha past medical history significant for hypertension, CAD status post cardiac stent, type 2 diabetes, CKD-III, peripheral artery disease, ureteral stone, prostate cancer, sciatica, gout who was admitted  with altered mental status in the setting of urinary tract infection.    Patient presented from [facility] long-term facility where he has been receiving therapy after recent discharge from [facility] for [place] on 5/29.    Wife reports that patient was at his baseline mental status until yesterday when she noticed that he was less communicative.    In spite of aggressive medical interventions over the past 9 days to treat urinary tract infection, rehydration with IV fluids and trial of tube feed, patient remains somnolent and unable to follow commands.  CT head was also negative for any acute neurologic abnormalities.  Wife faces advanced directive decisions, treatment option decisions and anticipatory care needs.   Clinical Assessment and Goals of Care:  This NP Wadie Lessen reviewed medical records, received report from team, assessed the patient and then meet at the patient's bedside along with his wife  to discuss diagnosis, prognosis, GOC, EOL wishes disposition and options.  Concept of Hospice and Palliative Care were discussed  A detailed discussion was had today regarding advanced directives.  Concepts specific to code status, artifical feeding and hydration, continued IV antibiotics and rehospitalization was had.  The difference between a aggressive medical intervention path  and  a palliative comfort care path for this patient at this time was had.  Values and goals of care important to patient and family were attempted to be elicited.  Is very comfortable and confident in her decision to shift to a full comfort path today.  Needs that she and her husband have had ongoing conversation regarding what each would want for themselves if indeed a situation like this occurred.  Her bowel was adamant about desire for a natural death.  MOST form  to reflect a full comfort path  Natural trajectory and expectations at EOL were discussed.  Questions and concerns addressed.   Family encouraged to call with questions or concerns.    PMT will continue to support holistically.  HCPOA    SUMMARY OF RECOMMENDATIONS    Code Status/Advance Care Planning:  DNR-documented today    Symptom Management:   Pain/Dyspnea: Roxanol 5 mg p.o./sublingual every 1 hour as needed for pain or dyspnea  Agitation: Ativan 1 mg po/sl every 4 hours as needed for agitation  Palliative Prophylaxis:   Aspiration, Frequent Pain Assessment, Oral Care and Turn Reposition  Additional Recommendations (Limitations, Scope, Preferences):  Full Comfort Care-no further diagnostics, no artificial feeding or hydration, no further IV antibiotics.  Minimize medications except those to enhance comfort  Focus of  care is comfort, quality and dignity  Psycho-social/Spiritual:   Mrs. Sluka shares a her husband's entrepreneurial spirit his whole life.  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  He was 1 of the first minority furriers  in [place].   They have lived in [place] for the last 15 years and have enjoyed their new home in [place].    Desire for further Chaplaincy support:yes-consulted spiritual care for end-of-life support.  Additional Recommendations: Education on Hospice  Prognosis:   < 2 weeks-   percent and residential hospice for  end-of-life care.  She is hopeful to access the hospice facility in [place].  I discussed with Carolynn Comment  Discharge Planning: Hospice facility      Primary Diagnoses: Present on Admission: . Acute pyelonephritis . AKI (acute kidney injury) ([place]) . Severe malnutrition ([place]) . Hydronephrosis . Pressure ulcer of right heel, unstageable ([place]) . Delirium due to another medical condition, acute, hypoactive . Blepharitis of both eyes . Hypertension   I have reviewed the medical record, interviewed the patient and family, and examined the patient. The following aspects are pertinent.  Past Medical History:  Diagnosis Date  . CAD (coronary artery disease)   . CKD (chronic kidney disease), stage III ([place])    [facility] care everywhere notes (08/13/2017)  . COPD (chronic obstructive pulmonary disease) ([place])    /spouse "not aware of this hx" (08/13/2017)  . Foot drop, left foot   . Gout   . Headache    "weekly since 06/2015" (08/13/2017)  . Hyperlipidemia   . Hypertension   . Myocardial infarction ([place])    /spouse "not aware of this hx" (08/13/2017)  . Prostate cancer ([place]) 2007   S/P radiation and OR  . Sciatic nerve pain, left   . Subdural hematoma ([place]) 06/12/2017   "no OR; hospitalized for 14 days" (08/13/2017)  . Type II diabetes mellitus ([place])    /care everywhere notes from [facility] (08/13/2017);  "tx'd 06/2017 because when he had head trauma, it affected his blood sugar, not tx'd since" (08/13/2017)   Social History   Socioeconomic History  . Marital status: Married    Spouse name: Not on file  . Number of children: Not on file  . Years of education: Not on file  . Highest education level: Not on file  Occupational History  . Not on file  Social Needs  . Financial resource strain: Not on file  . Food insecurity:    Worry: Not on file    Inability: Not on file  . Transportation needs:    Medical: Not on file    Non-medical: Not on file  Tobacco Use  .  Smoking status: Former Smoker    Packs/day: 0.33    Years: 64.00    Pack years: 21.12    Types: Cigarettes    Last attempt to quit: 04/23/2017    Years since quitting: 0.3  . Smokeless tobacco: Never Used  Substance and Sexual Activity  . Alcohol use: Not Currently    Frequency: Never  . Drug use: Never  . Sexual activity: Not on file  Lifestyle  . Physical activity:    Days per week: Not on file    Minutes per session: Not on file  . Stress: Not on file  Relationships  . Social connections:    Talks on phone: Not on file    Gets together: Not on file  Attends religious service: Not on file    Active member of club or organization: Not on file    Attends meetings of clubs or organizations: Not on file    Relationship status: Not on file  Other Topics Concern  . Not on file  Social History Narrative  . Not on file   History reviewed. No pertinent family history. Scheduled Meds: . amLODipine  10 mg Per NG tube Daily  . carvedilol  25 mg Per NG tube BID WC  . [START ON ] cloNIDine  0.3 mg Transdermal Weekly  . enalaprilat  1.25 mg Intravenous Q6H  . hydrochlorothiazide  12.5 mg Oral BID  . pravastatin  40 mg Oral q1800   Continuous Infusions: . sodium chloride 50 mL/hr at 08/18/17 1055  . sodium chloride 60 mL/hr at 08/20/17 1625  . cefTRIAXone (ROCEPHIN)  IV Stopped (08/20/17 1107)  . feeding supplement (JEVITY 1.2 CAL) 1,000 mL (08/20/17 1559)   PRN Meds:.sodium chloride, acetaminophen, metoprolol tartrate Medications Prior to Admission:  Prior to Admission medications   Medication Sig Start Date End Date Taking? Authorizing Provider  acetaminophen (TYLENOL) 325 MG tablet Take 650 mg by mouth every 6 (six) hours as needed for mild pain.   Yes [provider]  allopurinol (ZYLOPRIM) 300 MG tablet Take 300 mg by mouth daily.   Yes [provider]  amLODipine (NORVASC) 10 MG tablet Take 10 mg by mouth daily.   Yes [provider]    aspirin 81 MG chewable tablet Chew 81 mg by mouth daily.   Yes [provider]  cyclobenzaprine (FLEXERIL) 5 MG tablet Take 5 mg by mouth every 8 (eight) hours as needed for muscle spasms.   Yes [provider]  docusate sodium (COLACE) 100 MG capsule Take 100 mg by mouth 2 (two) times daily.   Yes [provider]  gabapentin (NEURONTIN) 300 MG capsule Take 300 mg by mouth 3 (three) times daily.   Yes [provider]  lisinopril (PRINIVIL,ZESTRIL) 20 MG tablet Take 20 mg by mouth daily.   Yes [provider]  metoprolol succinate (TOPROL-XL) 50 MG 24 hr tablet Take 50 mg by mouth daily. Take with or immediately following a meal.   Yes [provider]  Multiple Vitamin (MULTIVITAMIN WITH MINERALS) TABS tablet Take 1 tablet by mouth daily.   Yes [provider]  nitroGLYCERIN (NITROSTAT) 0.4 MG SL tablet Place 0.4 mg under the tongue every 5 (five) minutes as needed for chest pain.   Yes [provider]  Nutritional Supplements (NUTRITIONAL SUPPLEMENT PO) Take 120 mLs by mouth 2 (two) times daily. MedPass 2.0   Yes [provider]  senna-docusate (SENNA S) 8.6-50 MG tablet Take 1 tablet by mouth daily as needed (constipation).   Yes [provider]  tamsulosin (FLOMAX) 0.4 MG CAPS capsule Take 0.4 mg by mouth at bedtime.   Yes [provider]  traMADol (ULTRAM) 50 MG tablet Take 50 mg by mouth every 6 (six) hours as needed (pain).    Yes [provider]   Allergies  Allergen Reactions  . Atorvastatin Other (See Comments)    Severe leg cramps  . Other Other (See Comments)    Surgical tape, pulls skin off when tape is removed   Review of Systems  Unable to perform ROS: Mental status change    Physical Exam  Constitutional: He appears well-developed. He appears lethargic. He appears ill.  HENT:  audiblee throat secretions, too weak to clear  Cardiovascular: Tachycardia present.   Pulmonary/Chest: He has decreased breath sounds in the right lower field and the left lower field.  Neurological: He appears lethargic.  -Unable to follow commands  Skin: Skin is warm and dry.    Vital Signs: BP (!) 195/98 (BP Location: Right Arm)   Pulse (!) 105   Temp 98.9 F (37.2 C) (Oral)   Resp 20   Ht 5\' 8"  (1.727 m)   Wt 65 kg (143 lb 4.8 oz)   SpO2 92%   BMI 21.79 kg/m  Pain Scale: 0-10   Pain Score: Asleep   SpO2: SpO2: 92 % O2 Device:SpO2: 92 % O2 Flow Rate: .   IO: Intake/output summary:   Intake/Output Summary (Last 24 hours) at 08/21/2017 0840 Last data filed at 08/21/2017 0600 Gross per 24 hour  Intake 921 ml  Output 1825 ml  Net -904 ml    LBM: Last BM Date: 08/17/17 Baseline Weight: Weight: 66 kg (145 lb 8.1 oz) Most recent weight: Weight: 65 kg (143 lb 4.8 oz)     Palliative Assessment/Data:   Discussed with Dr Maurine Simmering and Family Medicine Team  Time In: 0830 Time Out: 0945 Time Total: 75 minutes Greater than 50%  of this time was spent counseling and coordinating care related to the above assessment and plan.  Signed by: Wadie Lessen, NP   Please contact Palliative Medicine Team phone at [phone] for questions and concerns.  For individual provider: See Shea Evans

## 2017-08-21 NOTE — Discharge Summary (Signed)
[facility] Discharge Summary  Patient name: Micheal Fox Medical record number: [id number] Date of birth: [birth year] Age: 77 y.o. Gender: male Date of Admission: 08/12/2017  Date of Discharge: 08/22/2017 Admitting Physician: Dickie La, MD  Primary Care Provider: Raelene Bott, MD Consultants: Urology, palliative care  Indication for Hospitalization: altered mental status  Discharge Diagnoses/Problem List:  Altered mental status, persistent Pyelonephritis AKI on CKD 3 Hypertension Hyperlipidemia CAD T2DM PAD Sciatica Prostate cancer s/p radical prostatectomy Gout Ureteral stent in L ureter  Disposition: [facility] [place]  Discharge Condition: worsening AMS, poor prognosis  Discharge Exam:  General: thin, elderly man lying in bed, blepharitis on bilateral eyes, unresponsive Cardiovascular: tachycardic, regular rhythm Respiratory: CTAB Abdomen: soft, nontender, nondistended Extremities: thin, no edema, warm  Brief Hospital Course:  Mr. Huss was admitted on 08/12/17 for altered mental status and was found to have a UTI.  He was started on vancomycin and zosyn then transitioned to ceftriaxone when his culture results showed E coli that was sensitive to cephalosporins.  His home urologist, Dr. Alto Denver, was consulted and agreed with this treatment plan.  When he did not improve, urology was consulted, and they replaced his left ureteral stent.  They also agreed on continuing ceftriaxone for his pyelonephritis.  Mr. Hartog mental status was poor throughout his admission but improved slightly on 6/21; however, it later worsened that weekend to where he was minimally responsive.  A CT head was performed on 6/22 and compared to the CT head done at admission.  Neurology and neurosurgery were asked to evaluate these studies, and they concluded that while he may have a small brain bleed, this would not explain his mental status changes.  Neurology recommended  an EEG, which was done.  It was significant for diffuse slowing but no epileptiform activity.  Ms. Marta Lamas, Mr. Loudermilk wife, was kept informed of these findings and was amenable to having a discussion with palliative care on 6/25.  After this discussion, Ms. Raye opted for full comfort care and discharge to [facility]. However, [place] could not take Mr. Provencal so he was taken to [facility].  Issues for Follow Up:  1. Provide full comfort care.  Significant Procedures: stent replacement  Significant Labs and Imaging:  Recent Labs  Lab 08/19/17 0803 08/20/17 0509 08/21/17 0556  WBC 15.1* 11.5* 16.6*  HGB 10.8* 9.8* 9.8*  HCT 33.0* 30.8* 30.8*  PLT 229 227 247   Recent Labs  Lab 08/17/17 0704 08/18/17 0834 08/19/17 0803 08/20/17 0509 08/21/17 0556  NA 149* 145 144 137 148*  K 4.9 4.9 4.8 3.8 4.0  CL 126* 122* 118* 112* 121*  CO2 14* 13* 15* 15* 18*  GLUCOSE 176* 185* 164* 129* 225*  BUN 60* 45* 37* 31* 32*  CREATININE 2.09* 1.53* 1.48* 1.35* 1.45*  CALCIUM 8.8* 8.8* 8.8* 7.7* 8.6*  MG  --   --   --   --  2.3  PHOS  --   --   --   --  3.0   Ct Abdomen Pelvis Wo Contrast  Result Date: 08/12/2017 CLINICAL DATA:  pyelonephritis EXAM: CT ABDOMEN AND PELVIS WITHOUT CONTRAST TECHNIQUE: Multidetector CT imaging of the abdomen and pelvis was performed following the standard protocol without IV contrast. COMPARISON:  CT 05/19/2015 FINDINGS: Lower chest: Lung bases are clear. Hepatobiliary: Central hepatic cysts. Gallbladder normal. No biliary duct dilatation. No IV contrast Pancreas: Pancreas is normal. No ductal dilatation. No pancreatic inflammation. Spleen: Normal spleen Adrenals/urinary  tract: Adrenal glands normal. Ureteral stent within the LEFT renal pelvis extending to the bladder. Mild hydronephrosis and hydroureter on the LEFT. RIGHT ureter mildly dilated to lesser degree. Bladder normal Stomach/Bowel: Stomach, small bowel, appendix, and cecum are normal. The colon  and rectosigmoid colon are normal. Vascular/Lymphatic: Abdominal aorta is normal caliber with atherosclerotic calcification. There is no retroperitoneal or periportal lymphadenopathy. No pelvic lymphadenopathy. Reproductive: Prostate poorly defined. There calcifications and vascular clips prostate bed Other: No free fluid. Musculoskeletal: No aggressive osseous lesion. IMPRESSION: 1. Hydronephrosis and hydroureter on the LEFT with ureteral stent in place. 2. Minimal hydroureter on the RIGHT. 3. Bladder normal. 4. Postsurgical change in the prostate bed. Electronically Signed   By: Suzy Bouchard M.D.   On: 08/12/2017 22:09   Dg Chest 1 View  Result Date: 08/15/2017 CLINICAL DATA:  Altered mental status.  Acute kidney injury. EXAM: CHEST  1 VIEW COMPARISON:  Single-view of the chest 08/12/2017. FINDINGS: Lungs are clear. Heart size is normal. No pneumothorax or pleural effusion. No acute bony abnormality. IMPRESSION: No acute disease. Electronically Signed   By: Inge Rise M.D.   On: 08/15/2017 11:35   Ct Head Wo Contrast  Result Date: 08/18/2017 CLINICAL DATA:  Altered mental status. Subarachnoid hemorrhage last month. EXAM: CT HEAD WITHOUT CONTRAST TECHNIQUE: Contiguous axial images were obtained from the base of the skull through the vertex without intravenous contrast. COMPARISON:  08/12/2017 FINDINGS: Brain: There is mild motion artifact. Trace subarachnoid hemorrhage is questioned posteriorly in the right sylvian fissure and over the lateral aspect of the right cerebellar hemisphere, however either or both of these may be artifactual. A tiny amount of subarachnoid blood is also not excluded posteriorly in the left sylvian fissure. No extra-axial fluid collection, acute large territory infarct, or midline shift is present. Mild-to-moderate ventriculomegaly is unchanged with temporal horn dilatation which may reflect a component of communicating hydrocephalus related to last month's subarachnoid  hemorrhage. Subcortical and periventricular white matter hypodensities are unchanged and nonspecific though may reflect moderate chronic small vessel ischemic disease and/or transependymal CSF flow. Vascular: Calcified atherosclerosis at the skull base. No hyperdense vessel. Skull: No fracture or focal osseous lesion. Sinuses/Orbits: Persistent left frontal sinus and partial anterior left ethmoid air cell opacification. Clear mastoid air cells. Bilateral cataract extraction. Other: None. IMPRESSION: 1. Trace subarachnoid hemorrhage versus artifact in the right greater than left sylvian fissures and right posterior fossa. 2. No extra-axial fluid collection or evidence of acute infarct. 3. Unchanged ventriculomegaly and periventricular white matter hypoattenuation as described above. These results will be called to the ordering clinician or representative by the Radiologist Assistant, and communication documented in the PACS or zVision Dashboard. Electronically Signed   By: Logan Bores M.D.   On: 08/18/2017 15:08   Ct Head Wo Contrast  Result Date: 08/12/2017 CLINICAL DATA:  Altered level of consciousness. EXAM: CT HEAD WITHOUT CONTRAST TECHNIQUE: Contiguous axial images were obtained from the base of the skull through the vertex without intravenous contrast. COMPARISON:  None FINDINGS: Brain: No evidence of acute infarction, hemorrhage, hydrocephalus, extra-axial collection or mass lesion/mass effect. Focal area of low attenuation within the proximal cervical cord may represent a syrinx. There is mild diffuse low-attenuation within the subcortical and periventricular white matter compatible with chronic microvascular disease. Prominence of sulci and ventricles identified compatible with brain atrophy. Vascular: No hyperdense vessel or unexpected calcification. Skull: Normal. Negative for fracture or focal lesion. Sinuses/Orbits: No acute finding. Other: None IMPRESSION: 1. No acute intracranial abnormalities. 2.  Chronic small vessel ischemic change and brain atrophy. Electronically Signed   By: Kerby Moors M.D.   On: 08/12/2017 17:09   Dg Cystogram  Result Date: 08/17/2017 CLINICAL DATA:  Left ureteral stent exchange EXAM: RETROGRADE PYELOGRAM COMPARISON:  08/12/2017 FINDINGS: Initial images demonstrate injection in the left ureter. The ureter is again dilated as is the collecting system. Left ureteral stent was then placed in satisfactory position. Distal left ureteral narrowing is noted just above the UVJ. Distal visualization of the right ureter shows no focal abnormality. A Foley catheter is seen within the bladder. IMPRESSION: Left ureteral stent exchange. Distal left ureteral narrowing. Electronically Signed   By: Inez Catalina M.D.   On: 08/17/2017 08:43   US Renal  Result Date: 08/16/2017 CLINICAL DATA:  Hydronephrosis. EXAM: RENAL / URINARY TRACT ULTRASOUND COMPLETE COMPARISON:  CT abdomen pelvis dated August 12, 2017. FINDINGS: Right Kidney: Length: 9.3 cm. Echogenicity within normal limits. No mass or hydronephrosis visualized. Small simple cyst measuring 1.2 cm. Left Kidney: Length: 9.2 cm. Echogenicity within normal limits. Stent visualized in the renal pelvis. Mild-to-moderate hydronephrosis, not significantly changed. No mass visualized. Bladder: There is layering, heterogeneously hypoechoic material in the posterior bladder. There is no internal vascularity. The bilateral ureteral jets are visualized. IMPRESSION: 1. Unchanged mild-to-moderate left hydronephrosis. Partially visualized stent in the left renal pelvis. 2. Layering, heterogeneously hypoechoic material in the posterior bladder without internal vascularity. While nonspecific, this may represent hematoma. Consider direct visualization for further evaluation. Electronically Signed   By: Titus Dubin M.D.   On: 08/16/2017 12:08   Dg Chest Port 1 View  Result Date: 08/20/2017 CLINICAL DATA:  Rhonchi, shortness of breath. EXAM: PORTABLE  CHEST 1 VIEW COMPARISON:  Chest x-rays dated 08/15/2017 and 04/23/2017. FINDINGS: Heart size and mediastinal contours are within normal limits. Lungs are clear. No pleural effusion or pneumothorax seen. Osseous structures about the chest are unremarkable. Enteric tube passes below the diaphragm. IMPRESSION: No active disease.  No evidence of pneumonia or pulmonary edema. Electronically Signed   By: Franki Cabot M.D.   On: 08/20/2017 19:19   Dg Chest Port 1 View  Result Date: 08/12/2017 CLINICAL DATA:  Fever.  Altered mental status. EXAM: PORTABLE CHEST 1 VIEW COMPARISON:  None FINDINGS: The heart size and mediastinal contours are within normal limits. Both lungs are clear. The visualized skeletal structures are unremarkable. IMPRESSION: No active disease. Electronically Signed   By: Kerby Moors M.D.   On: 08/12/2017 16:53   Dg Swallowing Func-speech Pathology  Result Date: 08/18/2017 Objective Swallowing Evaluation: Type of Study: MBS-Modified Barium Swallow Study  Patient Details Name: Jaston Havens MRN: [id number] Date of Birth: 09-16-1940 Today's Date: 08/18/2017 Time: SLP Start Time (ACUTE ONLY): 1135 -SLP Stop Time (ACUTE ONLY): 1150 SLP Time Calculation (min) (ACUTE ONLY): 15 min Past Medical History: Past Medical History: Diagnosis Date . CAD (coronary artery disease)  . CKD (chronic kidney disease), stage III ([place])   [facility] care everywhere notes (08/13/2017) . COPD (chronic obstructive pulmonary disease) ([place])   /spouse "not aware of this hx" (08/13/2017) . Foot drop, left foot  . Gout  . Headache   "weekly since 06/2015" (08/13/2017) . Hyperlipidemia  . Hypertension  . Myocardial infarction ([place])   /spouse "not aware of this hx" (08/13/2017) . Prostate cancer ([place]) 2007  S/P radiation and OR . Sciatic nerve pain, left  . Subdural hematoma ([place]) 06/12/2017  "no OR; hospitalized for 14 days" (08/13/2017) . Type II diabetes mellitus ([place])   /  care everywhere notes from [facility] (08/13/2017);   "tx'd 06/2017 because when he had head trauma, it affected his blood sugar, not tx'd since" (08/13/2017) Past Surgical History: Past Surgical History: Procedure Laterality Date . CATARACT EXTRACTION W/ INTRAOCULAR LENS  IMPLANT, BILATERAL Bilateral 2017 . CORONARY ANGIOPLASTY WITH STENT PLACEMENT   . CYSTOSCOPY W/ URETERAL STENT PLACEMENT Bilateral 08/16/2017  Procedure: CYSTOSCOPY WITH BILATERAL  RETROGRADE PYELOGRAM LEFT URETERAL STENT EXCHANGE;  Surgeon: Festus Aloe, MD;  Location: [place];  Service: Urology;  Laterality: Bilateral; . CYSTOSCOPY WITH FULGERATION  multiple  /care everywhere notes [facility] (08/13/2017) . PROSTATECTOMY  2007 . urinary stent  05/2017  [facility]; "has it changed q 3-6 months; last one was placed 05/2017; due to be changed 09/2017 (08/13/2017) HPI: Duston Smolenski is a 77 y.o. male with a past medical history significant for recent CVA, hypertension, CAD status post cardiac stent, type 2 diabetes, CKD-III, peripheral artery disease, ureteral stone, prostate cancer, sciatica, gout who presents today with altered mental status in the setting of urinary tract infection.   Subjective: patient awake sitting in chair in radiology suite, no attempts to speak, attention very poor Assessment / Plan / Recommendation CHL IP CLINICAL IMPRESSIONS 08/18/2017 Clinical Impression Patient exhibited a severe oropharyngeal dysphagia which is likely secondary to current cognitive impairment. Patient required maximal cues to initiate swallow of all consistencies and exhibited delays in swallow initiation to level of vallecular and pyriform sinuses. Full clearance of bolus from pharyngeal cavity was observed when swallow initiated and no residuals remained. Patient exhibited one instance of aspiration of small amount of nectar liquids during the swallow, and patient did not cough.(silent aspiration). Patient likely will be able to return to diet when his cognitive status improves, but currently he is  not safe for PO's (except meds in puree).  SLP Visit Diagnosis Dysphagia, oropharyngeal phase (R13.12) Attention and concentration deficit following -- Frontal lobe and executive function deficit following -- Impact on safety and function Severe aspiration risk   CHL IP TREATMENT RECOMMENDATION 08/18/2017 Treatment Recommendations Therapy as outlined in treatment plan below   Prognosis 08/18/2017 Prognosis for Safe Diet Advancement Good Barriers to Reach Goals Cognitive deficits Barriers/Prognosis Comment -- CHL IP DIET RECOMMENDATION 08/18/2017 SLP Diet Recommendations NPO Liquid Administration via -- Medication Administration Other (Comment) Compensations -- Postural Changes Seated upright at 90 degrees   CHL IP OTHER RECOMMENDATIONS 08/18/2017 Recommended Consults -- Oral Care Recommendations Oral care BID Other Recommendations --   CHL IP FOLLOW UP RECOMMENDATIONS 08/18/2017 Follow up Recommendations Skilled Nursing facility   [facility] IP FREQUENCY AND DURATION 08/18/2017 Speech Therapy Frequency (ACUTE ONLY) min 2x/week Treatment Duration 2 weeks      CHL IP ORAL PHASE 08/18/2017 Oral Phase Impaired Oral - Pudding Teaspoon -- Oral - Pudding Cup -- Oral - Honey Teaspoon -- Oral - Honey Cup Weak lingual manipulation;Reduced posterior propulsion;Delayed oral transit;Holding of bolus Oral - Nectar Teaspoon -- Oral - Nectar Cup Weak lingual manipulation;Delayed oral transit;Holding of bolus;Reduced posterior propulsion Oral - Nectar Straw -- Oral - Thin Teaspoon -- Oral - Thin Cup -- Oral - Thin Straw -- Oral - Puree Weak lingual manipulation;Delayed oral transit;Holding of bolus;Reduced posterior propulsion Oral - Mech Soft -- Oral - Regular -- Oral - Multi-Consistency -- Oral - Pill -- Oral Phase - Comment Oral phase appears to be significantly impacted by cognitive status  CHL IP PHARYNGEAL PHASE 08/18/2017 Pharyngeal Phase Impaired Pharyngeal- Pudding Teaspoon -- Pharyngeal -- Pharyngeal- Pudding Cup -- Pharyngeal --  Pharyngeal- Honey Teaspoon -- Pharyngeal -- Pharyngeal- Honey Cup Delayed swallow initiation-pyriform sinuses;Delayed swallow initiation-vallecula;Other (Comment) Pharyngeal -- Pharyngeal- Nectar Teaspoon -- Pharyngeal -- Pharyngeal- Nectar Cup Penetration/Aspiration during swallow;Delayed swallow initiation-pyriform sinuses;Delayed swallow initiation-vallecula;Reduced airway/laryngeal closure Pharyngeal Material enters airway, passes BELOW cords without attempt by patient to eject out (silent aspiration) Pharyngeal- Nectar Straw -- Pharyngeal -- Pharyngeal- Thin Teaspoon -- Pharyngeal -- Pharyngeal- Thin Cup -- Pharyngeal -- Pharyngeal- Thin Straw -- Pharyngeal -- Pharyngeal- Puree Delayed swallow initiation-vallecula;Delayed swallow initiation-pyriform sinuses Pharyngeal -- Pharyngeal- Mechanical Soft -- Pharyngeal -- Pharyngeal- Regular -- Pharyngeal -- Pharyngeal- Multi-consistency -- Pharyngeal -- Pharyngeal- Pill -- Pharyngeal -- Pharyngeal Comment Patient exhibited significant swallow initiation delays and oral holding  CHL IP CERVICAL ESOPHAGEAL PHASE 08/18/2017 Cervical Esophageal Phase WFL Pudding Teaspoon -- Pudding Cup -- Honey Teaspoon -- Honey Cup -- Nectar Teaspoon -- Nectar Cup -- Nectar Straw -- Thin Teaspoon -- Thin Cup -- Thin Straw -- Puree -- Mechanical Soft -- Regular -- Multi-consistency -- Pill -- Cervical Esophageal Comment -- Sonia Baller, MA, CCC-SLP 08/18/17 1:14 PM                Results/Tests Pending at Time of Discharge: none  Discharge Medications:  Allergies as of 08/22/2017      Reactions   Atorvastatin Other (See Comments)   Severe leg cramps   Other Other (See Comments)   Surgical tape, pulls skin off when tape is removed      Medication List    STOP taking these medications   allopurinol 300 MG tablet Commonly known as:  ZYLOPRIM   amLODipine 10 MG tablet Commonly known as:  NORVASC   aspirin 81 MG chewable tablet   cyclobenzaprine 5 MG tablet Commonly  known as:  FLEXERIL   gabapentin 300 MG capsule Commonly known as:  NEURONTIN   lisinopril 20 MG tablet Commonly known as:  PRINIVIL,ZESTRIL   metoprolol succinate 50 MG 24 hr tablet Commonly known as:  TOPROL-XL   multivitamin with minerals Tabs tablet   nitroGLYCERIN 0.4 MG SL tablet Commonly known as:  NITROSTAT   tamsulosin 0.4 MG Caps capsule Commonly known as:  FLOMAX   traMADol 50 MG tablet Commonly known as:  ULTRAM     TAKE these medications   acetaminophen 325 MG tablet Commonly known as:  TYLENOL Take 650 mg by mouth every 6 (six) hours as needed for mild pain.   docusate sodium 100 MG capsule Commonly known as:  COLACE Take 100 mg by mouth 2 (two) times daily.   NUTRITIONAL SUPPLEMENT PO Take 120 mLs by mouth 2 (two) times daily. MedPass 2.0   SENNA S 8.6-50 MG tablet Generic drug:  senna-docusate Take 1 tablet by mouth daily as needed (constipation).       Discharge Instructions: Please refer to Patient Instructions section of EMR for full details.  Patient was counseled important signs and symptoms that should prompt return to medical care, changes in medications, dietary instructions, activity restrictions, and follow up appointments.   Follow-Up Appointments: Follow-up Information    Call Risa Grill, MD.   Specialty:  Urology Contact information: [id number] [place] [address] [phone]           Nuala Alpha, DO 08/22/2017, 12:21 PM PGY-1, [facility]

## 2017-08-21 NOTE — Progress Notes (Signed)
SLP Cancellation Note  Patient Details Name: Micheal Fox MRN: [id number] DOB: November 11, 1940   Cancelled treatment:       Reason Eval/Treat Not Completed: Medical issues which prohibited therapy;Other (comment)(pt has [facility], will continue to follow for po readiness)   Micheal Fox 08/21/2017, 9:31 AM  Luanna Salk, [facility] SLP [phone]

## 2017-08-21 NOTE — Clinical Social Work Note (Addendum)
CSW advised this morning that patient made comfort care and wife wants Hospice facility in [place], [place]. CSW contacted [place] of [facility] in [place]  [phone]) and talked with Judson Roch to make referral and clinicals transmitted to facility. CSW made f/u calls to facility and during last call at 3:50 pm CSW provided with the following information: Patient does not appear to have symptoms to manage and does not qualify for GIP level of care at this time. Micheal Fox does qualify for routine residential care, however Medicare pays for Hospice care but not room and board, and this rate is $225.00 per day. Per Judson Roch, no routine residential beds available today, however patient will be added to wait list.   Micheal Fox contacted and updated. Wife indicated that she cannot afford $225 per day out-of-pocket and needs somewhere that Medicare will pay everything. Wife asked for another Hospice facility and chose [facility]. Call made to [facility] (4:10 pm) and talked with Webb Silversmith regarding patient referral made. CSW informed that chart will be reviewed and she will talk with wife and CSW tomorrow. Per Cheri, there may be ways to assist with what Medicare does not pay, based on the level of hospice care patient qualifies for. Micheal Fox contacted and updated. CSW also asked wife to think about an alternate plan if [facility] does not work out and presented the options: SNF level of care - private pay or home with hospice services that are not 24/7 care. CSW will follow-up with Cheri with [facility] and with wife regarding d/c plan tomorrow.  Talked with resident (4:50 pm) and update provided regarding hospice facility search.  Dellas Guard Givens, MSW, LCSW Licensed Clinical Social Worker [place] [phone]

## 2017-08-21 NOTE — Progress Notes (Signed)
Family Medicine Teaching Service Daily Progress Note Intern Pager: [phone]  Patient name: Micheal Fox Medical record number: [id number] Date of birth: 1940-06-20 Age: 77 y.o. Gender: male  Primary Care Provider: Raelene Bott, MD Consultants: Urology Code Status: full  Pt Overview and Major Events to Date:  Admitted 6/16 Started cefepime and vancomycin; stopped vancomycin 6/17 Switched abx to Keflex 6/18 Escalated abx to ceftriaxone 6/19 Increased ceftriaxone dose 6/20 Ureteral stent exchanged on 6/20 AMS improved 6/21 6/22 - CT head repeated with motion artifact, ?trace [facility]  6/23-24 - worsening mental status Transition to full comfort care with plans to go to [facility] and Plan:  Micheal Fox is a 77 y.o. male with a past medical history significant for hypertension, CAD status post cardiac stent, type 2 diabetes, CKD-III, peripheral artery disease, ureteral stone, prostate cancer, sciatica, gout who presents with altered mental status in the setting of urinary tract infection.    Altered mental status, acute, worsening Likely due to infection, although he does have deficits at baseline due to previous [place].  Question delerium or sundowning, minimally verbal this am after awakening. 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 Expanded workup on 6/23, including EEG and ammonia. Ammonia elevated, although appears noncirrhotic hyperammonemia, will treat with lactulose.  EEG with diffuse slowing, no epileptiform activity. --NPO due to AMS --Acetaminophen 650 mg every 6 as needed - foley in place, urine appears clear - patient's wife talked with palliative care on 6/25 and has decided to make him full comfort care  Pyelonephritis Last fever 0900 6/21, WBC back up to 16.6 on 6/25 from 11.5 on 6/24.  - stop ceftriaxone d/t comfort care  Hypernatremia,  resolved Sodium 137 on 6/24 - stop labs  AKI on CKD3, improving On admission patient's creatinine is 2.45, is 1.35 on 6/24.  Baseline appears to be around 1.7-1.8 based on [facility] record.  Cr at baseline today. - stop labs  Hypertension, persistent Patient's blood pressure has been high during admission, last reading 195/98.  High blood pressure increases patient's risk of worsening cerebral hemorrhage, so systolic threshold is 147.  Patient is on lisinopril, amlodipine, metoprolol at home. - stop BP checks and BP medications  Type 2 diabetes Most recent A1c 7.2.  Likely will not need glycemic control while in hospital given age and A1c. - stop BMP checks  PAD Arterial duplex on 4/8 2019 show evidence of arterial obstruction involving the SFA and or popliteal artery and tibioperoneal vessels.   Tobacco use Two cigarettes daily  FEN/GI: NPO PPx: SCDs  Disposition: SNF  Subjective:  Eyes open but unresponsive today.  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, unresponsive Cardiovascular: tachycardic, regular rhythm Respiratory: CTAB Abdomen: soft, nontender, nondistended Extremities: thin, no edema, warm  Laboratory: Recent Labs  Lab 08/19/17 0803 08/20/17 0509 08/21/17 0556  WBC 15.1* 11.5* 16.6*  HGB 10.8* 9.8* 9.8*  HCT 33.0* 30.8* 30.8*  PLT 229 227 247   Recent Labs  Lab 08/18/17 0834 08/19/17 0803 08/20/17 0509  NA 145 144 137  K 4.9 4.8 3.8  CL 122* 118* 112*  CO2 13* 15* 15*  BUN 45* 37* 31*  CREATININE 1.53* 1.48* 1.35*  CALCIUM 8.8* 8.8* 7.7*  GLUCOSE 185* 164* 129*    Imaging/Diagnostic Tests: Ct Abdomen Pelvis Wo Contrast  Result Date: 08/12/2017 CLINICAL  DATA:  pyelonephritis EXAM: CT ABDOMEN AND PELVIS WITHOUT CONTRAST TECHNIQUE: Multidetector CT imaging of the abdomen and pelvis was performed following the standard protocol without IV contrast. COMPARISON:  CT 05/19/2015 FINDINGS: Lower chest: Lung bases are clear. Hepatobiliary: Central hepatic cysts. Gallbladder normal. No biliary duct dilatation. No IV contrast Pancreas: Pancreas is normal. No ductal dilatation. No pancreatic inflammation. Spleen: Normal spleen Adrenals/urinary tract: Adrenal glands normal. Ureteral stent within the LEFT renal pelvis extending to the bladder. Mild hydronephrosis and hydroureter on the LEFT. RIGHT ureter mildly dilated to lesser degree. Bladder normal Stomach/Bowel: Stomach, small bowel, appendix, and cecum are normal. The colon and rectosigmoid colon are normal. Vascular/Lymphatic: Abdominal aorta is normal caliber with atherosclerotic calcification. There is no retroperitoneal or periportal lymphadenopathy. No pelvic lymphadenopathy. Reproductive: Prostate poorly defined. There calcifications and vascular clips prostate bed Other: No free fluid. Musculoskeletal: No aggressive osseous lesion. IMPRESSION: 1. Hydronephrosis and hydroureter on the LEFT with ureteral stent in place. 2. Minimal hydroureter on the RIGHT. 3. Bladder normal. 4. Postsurgical change in the prostate bed. Electronically Signed   By: Suzy Bouchard M.D.   On: 08/12/2017 22:09   Dg Chest 1 View  Result Date: 08/15/2017 CLINICAL DATA:  Altered mental status.  Acute kidney injury. EXAM: CHEST  1 VIEW COMPARISON:  Single-view of the chest 08/12/2017. FINDINGS: Lungs are clear. Heart size is normal. No pneumothorax or pleural effusion. No acute bony abnormality. IMPRESSION: No acute disease. Electronically Signed   By: Inge Rise M.D.   On: 08/15/2017 11:35   Ct Head Wo Contrast  Result Date: 08/18/2017 CLINICAL DATA:  Altered mental status. Subarachnoid hemorrhage last month. EXAM: CT HEAD  WITHOUT CONTRAST TECHNIQUE: Contiguous axial images were obtained from the base of the skull through the vertex without intravenous contrast. COMPARISON:  08/12/2017 FINDINGS: Brain: There is mild motion artifact. Trace subarachnoid hemorrhage is questioned posteriorly in the right sylvian fissure and over the lateral aspect of the right cerebellar hemisphere, however either or both of these may be artifactual. A tiny amount of subarachnoid blood is also not excluded posteriorly in the left sylvian fissure. No extra-axial fluid collection, acute large territory infarct, or midline shift is present. Mild-to-moderate ventriculomegaly is unchanged with temporal horn dilatation which may reflect a component of communicating hydrocephalus related to last month's subarachnoid hemorrhage. Subcortical and periventricular white matter hypodensities are unchanged and nonspecific though may reflect moderate chronic small vessel ischemic disease and/or transependymal CSF flow. Vascular: Calcified atherosclerosis at the skull base. No hyperdense vessel. Skull: No fracture or focal osseous lesion. Sinuses/Orbits: Persistent left frontal sinus and partial anterior left ethmoid air cell opacification. Clear mastoid air cells. Bilateral cataract extraction. Other: None. IMPRESSION: 1. Trace subarachnoid hemorrhage versus artifact in the right greater than left sylvian fissures and right posterior fossa. 2. No extra-axial fluid collection or evidence of acute infarct. 3. Unchanged ventriculomegaly and periventricular white matter hypoattenuation as described above. These results will be called to the ordering clinician or representative by the Radiologist Assistant, and communication documented in the PACS or zVision Dashboard. Electronically Signed   By: Logan Bores M.D.   On: 08/18/2017 15:08   Ct Head Wo Contrast  Result Date: 08/12/2017 CLINICAL DATA:  Altered level of consciousness. EXAM: CT HEAD WITHOUT CONTRAST TECHNIQUE:  Contiguous axial images were  obtained from the base of the skull through the vertex without intravenous contrast. COMPARISON:  None FINDINGS: Brain: No evidence of acute infarction, hemorrhage, hydrocephalus, extra-axial collection or mass lesion/mass effect. Focal area of low attenuation within the proximal cervical cord may represent a syrinx. There is mild diffuse low-attenuation within the subcortical and periventricular white matter compatible with chronic microvascular disease. Prominence of sulci and ventricles identified compatible with brain atrophy. Vascular: No hyperdense vessel or unexpected calcification. Skull: Normal. Negative for fracture or focal lesion. Sinuses/Orbits: No acute finding. Other: None IMPRESSION: 1. No acute intracranial abnormalities. 2. Chronic small vessel ischemic change and brain atrophy. Electronically Signed   By: Kerby Moors M.D.   On: 08/12/2017 17:09   Dg Cystogram  Result Date: 08/17/2017 CLINICAL DATA:  Left ureteral stent exchange EXAM: RETROGRADE PYELOGRAM COMPARISON:  08/12/2017 FINDINGS: Initial images demonstrate injection in the left ureter. The ureter is again dilated as is the collecting system. Left ureteral stent was then placed in satisfactory position. Distal left ureteral narrowing is noted just above the UVJ. Distal visualization of the right ureter shows no focal abnormality. A Foley catheter is seen within the bladder. IMPRESSION: Left ureteral stent exchange. Distal left ureteral narrowing. Electronically Signed   By: Inez Catalina M.D.   On: 08/17/2017 08:43   US Renal  Result Date: 08/16/2017 CLINICAL DATA:  Hydronephrosis. EXAM: RENAL / URINARY TRACT ULTRASOUND COMPLETE COMPARISON:  CT abdomen pelvis dated August 12, 2017. FINDINGS: Right Kidney: Length: 9.3 cm. Echogenicity within normal limits. No mass or hydronephrosis visualized. Small simple cyst measuring 1.2 cm. Left Kidney: Length: 9.2 cm. Echogenicity within normal limits. Stent  visualized in the renal pelvis. Mild-to-moderate hydronephrosis, not significantly changed. No mass visualized. Bladder: There is layering, heterogeneously hypoechoic material in the posterior bladder. There is no internal vascularity. The bilateral ureteral jets are visualized. IMPRESSION: 1. Unchanged mild-to-moderate left hydronephrosis. Partially visualized stent in the left renal pelvis. 2. Layering, heterogeneously hypoechoic material in the posterior bladder without internal vascularity. While nonspecific, this may represent hematoma. Consider direct visualization for further evaluation. Electronically Signed   By: Titus Dubin M.D.   On: 08/16/2017 12:08   Dg Chest Port 1 View  Result Date: 08/20/2017 CLINICAL DATA:  Rhonchi, shortness of breath. EXAM: PORTABLE CHEST 1 VIEW COMPARISON:  Chest x-rays dated 08/15/2017 and 04/23/2017. FINDINGS: Heart size and mediastinal contours are within normal limits. Lungs are clear. No pleural effusion or pneumothorax seen. Osseous structures about the chest are unremarkable. Enteric tube passes below the diaphragm. IMPRESSION: No active disease.  No evidence of pneumonia or pulmonary edema. Electronically Signed   By: Franki Cabot M.D.   On: 08/20/2017 19:19   Dg Chest Port 1 View  Result Date: 08/12/2017 CLINICAL DATA:  Fever.  Altered mental status. EXAM: PORTABLE CHEST 1 VIEW COMPARISON:  None FINDINGS: The heart size and mediastinal contours are within normal limits. Both lungs are clear. The visualized skeletal structures are unremarkable. IMPRESSION: No active disease. Electronically Signed   By: Kerby Moors M.D.   On: 08/12/2017 16:53   Dg Swallowing Func-speech Pathology  Result Date: 08/18/2017 Objective Swallowing Evaluation: Type of Study: MBS-Modified Barium Swallow Study  Patient Details Name: Cletis Clack MRN: [id number] Date of Birth: 05-27-1940 Today's Date: 08/18/2017 Time: SLP Start Time (ACUTE ONLY): 1135 -SLP Stop Time (ACUTE ONLY): 1150  SLP Time Calculation (min) (ACUTE ONLY): 15 min Past Medical History: Past Medical History: Diagnosis Date . CAD (coronary artery disease)  . CKD (chronic kidney disease),  stage III ([place])   [facility] care everywhere notes (08/13/2017) . COPD (chronic obstructive pulmonary disease) ([place])   /spouse "not aware of this hx" (08/13/2017) . Foot drop, left foot  . Gout  . Headache   "weekly since 06/2015" (08/13/2017) . Hyperlipidemia  . Hypertension  . Myocardial infarction ([place])   /spouse "not aware of this hx" (08/13/2017) . Prostate cancer ([place]) 2007  S/P radiation and OR . Sciatic nerve pain, left  . Subdural hematoma ([place]) 06/12/2017  "no OR; hospitalized for 14 days" (08/13/2017) . Type II diabetes mellitus ([place])   /care everywhere notes from [facility] (08/13/2017);  "tx'd 06/2017 because when he had head trauma, it affected his blood sugar, not tx'd since" (08/13/2017) Past Surgical History: Past Surgical History: Procedure Laterality Date . CATARACT EXTRACTION W/ INTRAOCULAR LENS  IMPLANT, BILATERAL Bilateral 2017 . CORONARY ANGIOPLASTY WITH STENT PLACEMENT   . CYSTOSCOPY W/ URETERAL STENT PLACEMENT Bilateral 08/16/2017  Procedure: CYSTOSCOPY WITH BILATERAL  RETROGRADE PYELOGRAM LEFT URETERAL STENT EXCHANGE;  Surgeon: Festus Aloe, MD;  Location: [place];  Service: Urology;  Laterality: Bilateral; . CYSTOSCOPY WITH FULGERATION  multiple  /care everywhere notes [facility] (08/13/2017) . PROSTATECTOMY  2007 . urinary stent  05/2017  [facility]; "has it changed q 3-6 months; last one was placed 05/2017; due to be changed 09/2017 (08/13/2017) HPI: Rubin Dais is a 77 y.o. male with a past medical history significant for recent CVA, hypertension, CAD status post cardiac stent, type 2 diabetes, CKD-III, peripheral artery disease, ureteral stone, prostate cancer, sciatica, gout who presents today with altered mental status in the setting of urinary tract infection.   Subjective: patient awake sitting in chair  in radiology suite, no attempts to speak, attention very poor Assessment / Plan / Recommendation CHL IP CLINICAL IMPRESSIONS 08/18/2017 Clinical Impression Patient exhibited a severe oropharyngeal dysphagia which is likely secondary to current cognitive impairment. Patient required maximal cues to initiate swallow of all consistencies and exhibited delays in swallow initiation to level of vallecular and pyriform sinuses. Full clearance of bolus from pharyngeal cavity was observed when swallow initiated and no residuals remained. Patient exhibited one instance of aspiration of small amount of nectar liquids during the swallow, and patient did not cough.(silent aspiration). Patient likely will be able to return to diet when his cognitive status improves, but currently he is not safe for PO's (except meds in puree).  SLP Visit Diagnosis Dysphagia, oropharyngeal phase (R13.12) Attention and concentration deficit following -- Frontal lobe and executive function deficit following -- Impact on safety and function Severe aspiration risk   CHL IP TREATMENT RECOMMENDATION 08/18/2017 Treatment Recommendations Therapy as outlined in treatment plan below   Prognosis 08/18/2017 Prognosis for Safe Diet Advancement Good Barriers to Reach Goals Cognitive deficits Barriers/Prognosis Comment -- CHL IP DIET RECOMMENDATION 08/18/2017 SLP Diet Recommendations NPO Liquid Administration via -- Medication Administration Other (Comment) Compensations -- Postural Changes Seated upright at 90 degrees   CHL IP OTHER RECOMMENDATIONS 08/18/2017 Recommended Consults -- Oral Care Recommendations Oral care BID Other Recommendations --   CHL IP FOLLOW UP RECOMMENDATIONS 08/18/2017 Follow up Recommendations Skilled Nursing facility   [facility] IP FREQUENCY AND DURATION 08/18/2017 Speech Therapy Frequency (ACUTE ONLY) min 2x/week Treatment Duration 2 weeks      CHL IP ORAL PHASE 08/18/2017 Oral Phase Impaired Oral - Pudding Teaspoon -- Oral - Pudding Cup -- Oral -  Honey Teaspoon -- Oral - Honey Cup Weak lingual manipulation;Reduced posterior propulsion;Delayed oral transit;Holding of bolus  Oral - Nectar Teaspoon -- Oral - Nectar Cup Weak lingual manipulation;Delayed oral transit;Holding of bolus;Reduced posterior propulsion Oral - Nectar Straw -- Oral - Thin Teaspoon -- Oral - Thin Cup -- Oral - Thin Straw -- Oral - Puree Weak lingual manipulation;Delayed oral transit;Holding of bolus;Reduced posterior propulsion Oral - Mech Soft -- Oral - Regular -- Oral - Multi-Consistency -- Oral - Pill -- Oral Phase - Comment Oral phase appears to be significantly impacted by cognitive status  CHL IP PHARYNGEAL PHASE 08/18/2017 Pharyngeal Phase Impaired Pharyngeal- Pudding Teaspoon -- Pharyngeal -- Pharyngeal- Pudding Cup -- Pharyngeal -- Pharyngeal- Honey Teaspoon -- Pharyngeal -- Pharyngeal- Honey Cup Delayed swallow initiation-pyriform sinuses;Delayed swallow initiation-vallecula;Other (Comment) Pharyngeal -- Pharyngeal- Nectar Teaspoon -- Pharyngeal -- Pharyngeal- Nectar Cup Penetration/Aspiration during swallow;Delayed swallow initiation-pyriform sinuses;Delayed swallow initiation-vallecula;Reduced airway/laryngeal closure Pharyngeal Material enters airway, passes BELOW cords without attempt by patient to eject out (silent aspiration) Pharyngeal- Nectar Straw -- Pharyngeal -- Pharyngeal- Thin Teaspoon -- Pharyngeal -- Pharyngeal- Thin Cup -- Pharyngeal -- Pharyngeal- Thin Straw -- Pharyngeal -- Pharyngeal- Puree Delayed swallow initiation-vallecula;Delayed swallow initiation-pyriform sinuses Pharyngeal -- Pharyngeal- Mechanical Soft -- Pharyngeal -- Pharyngeal- Regular -- Pharyngeal -- Pharyngeal- Multi-consistency -- Pharyngeal -- Pharyngeal- Pill -- Pharyngeal -- Pharyngeal Comment Patient exhibited significant swallow initiation delays and oral holding  CHL IP CERVICAL ESOPHAGEAL PHASE 08/18/2017 Cervical Esophageal Phase WFL Pudding Teaspoon -- Pudding Cup -- Honey Teaspoon --  Honey Cup -- Nectar Teaspoon -- Nectar Cup -- Nectar Straw -- Thin Teaspoon -- Thin Cup -- Thin Straw -- Puree -- Mechanical Soft -- Regular -- Multi-consistency -- Pill -- Cervical Esophageal Comment -- Sonia Baller, MA, CCC-SLP 08/18/17 1:14 PM                Kathrene Alu, MD 08/21/2017, 7:12 AM PGY-1, [place] Intern pager: [phone], text pages welcome

## 2017-08-21 NOTE — Progress Notes (Signed)
   08/21/17 1300  Clinical Encounter Type  Visited With Patient  Visit Type Initial  Referral From Nurse  Consult/Referral To Chaplain  Spiritual Encounters  Spiritual Needs Emotional  Stress Factors  Patient Stress Factors Exhausted  Family Stress Factors Exhausted    Pt was asleep and seemingly very sick. Chaplain tried talking but no response. Chaplain later had a conversation with pt's direct care nurse who said pt's wife had left and will be back. Chaplain provided compassionate presence and promised to show up when nurse calls after family appears.  Nathalee Smarr a Medical sales representative, [employer]

## 2017-08-21 NOTE — Progress Notes (Signed)
Nutrition Brief Note  Chart reviewed. Pt now transitioning to comfort care. Cortrak tube to be removed, TF orders were discontinued.  No further nutrition interventions warranted at this time.  Please re-consult as needed.   Kerman Passey MS, RD, [place], [place] [phone] Pager  [phone] Weekend/On-Call Pager

## 2017-08-22 NOTE — Progress Notes (Signed)
Patient left with transport.

## 2017-08-22 NOTE — Progress Notes (Signed)
Report called to laura RN at hospice IV to be left in place per her request.  Transport scheduled for 3pm. Wife is at the bedside is aware of discharge plan.

## 2017-08-22 NOTE — Progress Notes (Signed)
[place]: Met with pt and his wife at bedside. Discussed care at [facility]. Pt doesn't respond to the conversation. He does have eyes open but does not appear to be receptive to what is going on or make eye contact when spoken to. The pt's wife verbalizes understanding the hospice philopshy and the comfort care approach. She did sign papers for pt to transfer to hospice home. Lorriane Shire aware of the pt's wishes. MD is aware that we have offered bed and family has accepted. Ambulance arranged and set up for 300pm with [place] ambulance company. Webb Silversmith RN [phone]

## 2017-08-22 NOTE — Progress Notes (Signed)
Family Medicine Teaching Service Daily Progress Note Intern Pager: [phone]  Patient name: Micheal Fox Medical record number: [id number] Date of birth: 07-Jan-1941 Age: 77 y.o. Gender: male  Primary Care Provider: Raelene Bott, MD Consultants: Urology Code Status: full  Pt Overview and Major Events to Date:  Admitted 6/16 Started cefepime and vancomycin; stopped vancomycin 6/17 Switched abx to Keflex 6/18 Escalated abx to ceftriaxone 6/19 Increased ceftriaxone dose 6/20 Ureteral stent exchanged on 6/20 AMS improved 6/21 6/22 - CT head repeated with motion artifact, ?[facility]  6/23-24 - worsening mental status Transition to full comfort care with plans to go to Hospice at [place] and Plan:  Micheal Fox is a 77 y.o. male with a past medical history significant for hypertension, CAD status post cardiac stent, type 2 diabetes, CKD-III, peripheral artery disease, ureteral stone, prostate cancer, sciatica, gout who presents with altered mental status in the setting of urinary tract infection.    Altered mental status, acute, worsening Due to worsening mental status after thorough workup  Pyelonephritis - stop ceftriaxone d/t comfort care  Hypernatremia, resolved - stop labs  AKI on CKD3, improving - stop labs  Hypertension, persistent - stop BP checks and BP medications  Type 2 diabetes - stop BMP checks  PAD Arterial duplex on 4/8 2019 show evidence of arterial obstruction involving the SFA and or popliteal artery and tibioperoneal vessels.  Tobacco use Two cigarettes daily  FEN/GI: NPO PPx: SCDs  Disposition: SNF  Subjective:  Eyes closed, unresponsive.  Objective: Temp:  [97.4 F (36.3 C)-100 F (37.8 C)] 100 F (37.8 C) (06/25 2150) Pulse Rate:  [106-113] 113 (06/25 2150) Resp:  [22-23] 23 (06/25 2150) BP: (181-183)/(81-90) 183/90 (06/25 2150) SpO2:  [98 %] 98 % (06/25 2150) Weight:  [143 lb 4.8 oz (65 kg)] 143 lb 4.8 oz (65 kg)  (06/25 0930) Physical Exam: General: thin, elderly man lying in bed, unresponsive, some twitching head movements, does not appear uncomfortable Cardiovascular: tachycardic, regular rhythm Respiratory: CTAB Abdomen: soft, nontender, nondistended Extremities: thin, no edema, warm  Laboratory: Recent Labs  Lab 08/19/17 0803 08/20/17 0509 08/21/17 0556  WBC 15.1* 11.5* 16.6*  HGB 10.8* 9.8* 9.8*  HCT 33.0* 30.8* 30.8*  PLT 229 227 247   Recent Labs  Lab 08/19/17 0803 08/20/17 0509 08/21/17 0556  NA 144 137 148*  K 4.8 3.8 4.0  CL 118* 112* 121*  CO2 15* 15* 18*  BUN 37* 31* 32*  CREATININE 1.48* 1.35* 1.45*  CALCIUM 8.8* 7.7* 8.6*  GLUCOSE 164* 129* 225*    Imaging/Diagnostic Tests: Ct Abdomen Pelvis Wo Contrast  Result Date: 08/12/2017 CLINICAL DATA:  pyelonephritis EXAM: CT ABDOMEN AND PELVIS WITHOUT CONTRAST TECHNIQUE: Multidetector CT imaging of the abdomen and pelvis was performed following the standard protocol without IV contrast. COMPARISON:  CT 05/19/2015 FINDINGS: Lower chest: Lung bases are clear. Hepatobiliary: Central hepatic cysts. Gallbladder normal. No biliary duct dilatation. No IV contrast Pancreas: Pancreas is normal. No ductal dilatation. No pancreatic inflammation. Spleen: Normal spleen Adrenals/urinary tract: Adrenal glands normal. Ureteral stent within the LEFT renal pelvis extending to the bladder. Mild hydronephrosis and hydroureter on the LEFT. RIGHT ureter mildly dilated to lesser degree. Bladder normal Stomach/Bowel: Stomach, small bowel, appendix, and cecum are normal. The colon and rectosigmoid colon are normal. Vascular/Lymphatic: Abdominal aorta is normal caliber with atherosclerotic calcification. There is no retroperitoneal or periportal lymphadenopathy. No pelvic lymphadenopathy. Reproductive: Prostate poorly defined. There calcifications and vascular clips prostate bed Other: No free fluid. Musculoskeletal: No aggressive  osseous lesion. IMPRESSION:  1. Hydronephrosis and hydroureter on the LEFT with ureteral stent in place. 2. Minimal hydroureter on the RIGHT. 3. Bladder normal. 4. Postsurgical change in the prostate bed. Electronically Signed   By: Suzy Bouchard M.D.   On: 08/12/2017 22:09   Dg Chest 1 View  Result Date: 08/15/2017 CLINICAL DATA:  Altered mental status.  Acute kidney injury. EXAM: CHEST  1 VIEW COMPARISON:  Single-view of the chest 08/12/2017. FINDINGS: Lungs are clear. Heart size is normal. No pneumothorax or pleural effusion. No acute bony abnormality. IMPRESSION: No acute disease. Electronically Signed   By: Inge Rise M.D.   On: 08/15/2017 11:35   Ct Head Wo Contrast  Result Date: 08/18/2017 CLINICAL DATA:  Altered mental status. Subarachnoid hemorrhage last month. EXAM: CT HEAD WITHOUT CONTRAST TECHNIQUE: Contiguous axial images were obtained from the base of the skull through the vertex without intravenous contrast. COMPARISON:  08/12/2017 FINDINGS: Brain: There is mild motion artifact. Trace subarachnoid hemorrhage is questioned posteriorly in the right sylvian fissure and over the lateral aspect of the right cerebellar hemisphere, however either or both of these may be artifactual. A tiny amount of subarachnoid blood is also not excluded posteriorly in the left sylvian fissure. No extra-axial fluid collection, acute large territory infarct, or midline shift is present. Mild-to-moderate ventriculomegaly is unchanged with temporal horn dilatation which may reflect a component of communicating hydrocephalus related to last month's subarachnoid hemorrhage. Subcortical and periventricular white matter hypodensities are unchanged and nonspecific though may reflect moderate chronic small vessel ischemic disease and/or transependymal CSF flow. Vascular: Calcified atherosclerosis at the skull base. No hyperdense vessel. Skull: No fracture or focal osseous lesion. Sinuses/Orbits: Persistent left frontal sinus and partial  anterior left ethmoid air cell opacification. Clear mastoid air cells. Bilateral cataract extraction. Other: None. IMPRESSION: 1. Trace subarachnoid hemorrhage versus artifact in the right greater than left sylvian fissures and right posterior fossa. 2. No extra-axial fluid collection or evidence of acute infarct. 3. Unchanged ventriculomegaly and periventricular white matter hypoattenuation as described above. These results will be called to the ordering clinician or representative by the Radiologist Assistant, and communication documented in the PACS or zVision Dashboard. Electronically Signed   By: Logan Bores M.D.   On: 08/18/2017 15:08   Ct Head Wo Contrast  Result Date: 08/12/2017 CLINICAL DATA:  Altered level of consciousness. EXAM: CT HEAD WITHOUT CONTRAST TECHNIQUE: Contiguous axial images were obtained from the base of the skull through the vertex without intravenous contrast. COMPARISON:  None FINDINGS: Brain: No evidence of acute infarction, hemorrhage, hydrocephalus, extra-axial collection or mass lesion/mass effect. Focal area of low attenuation within the proximal cervical cord may represent a syrinx. There is mild diffuse low-attenuation within the subcortical and periventricular white matter compatible with chronic microvascular disease. Prominence of sulci and ventricles identified compatible with brain atrophy. Vascular: No hyperdense vessel or unexpected calcification. Skull: Normal. Negative for fracture or focal lesion. Sinuses/Orbits: No acute finding. Other: None IMPRESSION: 1. No acute intracranial abnormalities. 2. Chronic small vessel ischemic change and brain atrophy. Electronically Signed   By: Kerby Moors M.D.   On: 08/12/2017 17:09   Dg Cystogram  Result Date: 08/17/2017 CLINICAL DATA:  Left ureteral stent exchange EXAM: RETROGRADE PYELOGRAM COMPARISON:  08/12/2017 FINDINGS: Initial images demonstrate injection in the left ureter. The ureter is again dilated as is the  collecting system. Left ureteral stent was then placed in satisfactory position. Distal left ureteral narrowing is noted just above the UVJ. Distal visualization of  the right ureter shows no focal abnormality. A Foley catheter is seen within the bladder. IMPRESSION: Left ureteral stent exchange. Distal left ureteral narrowing. Electronically Signed   By: Inez Catalina M.D.   On: 08/17/2017 08:43   US Renal  Result Date: 08/16/2017 CLINICAL DATA:  Hydronephrosis. EXAM: RENAL / URINARY TRACT ULTRASOUND COMPLETE COMPARISON:  CT abdomen pelvis dated August 12, 2017. FINDINGS: Right Kidney: Length: 9.3 cm. Echogenicity within normal limits. No mass or hydronephrosis visualized. Small simple cyst measuring 1.2 cm. Left Kidney: Length: 9.2 cm. Echogenicity within normal limits. Stent visualized in the renal pelvis. Mild-to-moderate hydronephrosis, not significantly changed. No mass visualized. Bladder: There is layering, heterogeneously hypoechoic material in the posterior bladder. There is no internal vascularity. The bilateral ureteral jets are visualized. IMPRESSION: 1. Unchanged mild-to-moderate left hydronephrosis. Partially visualized stent in the left renal pelvis. 2. Layering, heterogeneously hypoechoic material in the posterior bladder without internal vascularity. While nonspecific, this may represent hematoma. Consider direct visualization for further evaluation. Electronically Signed   By: Titus Dubin M.D.   On: 08/16/2017 12:08   Dg Chest Port 1 View  Result Date: 08/20/2017 CLINICAL DATA:  Rhonchi, shortness of breath. EXAM: PORTABLE CHEST 1 VIEW COMPARISON:  Chest x-rays dated 08/15/2017 and 04/23/2017. FINDINGS: Heart size and mediastinal contours are within normal limits. Lungs are clear. No pleural effusion or pneumothorax seen. Osseous structures about the chest are unremarkable. Enteric tube passes below the diaphragm. IMPRESSION: No active disease.  No evidence of pneumonia or pulmonary edema.  Electronically Signed   By: Franki Cabot M.D.   On: 08/20/2017 19:19   Dg Chest Port 1 View  Result Date: 08/12/2017 CLINICAL DATA:  Fever.  Altered mental status. EXAM: PORTABLE CHEST 1 VIEW COMPARISON:  None FINDINGS: The heart size and mediastinal contours are within normal limits. Both lungs are clear. The visualized skeletal structures are unremarkable. IMPRESSION: No active disease. Electronically Signed   By: Kerby Moors M.D.   On: 08/12/2017 16:53   Dg Swallowing Func-speech Pathology  Result Date: 08/18/2017 Objective Swallowing Evaluation: Type of Study: MBS-Modified Barium Swallow Study  Patient Details Name: Dyron Kawano MRN: [id number] Date of Birth: Aug 12, 1940 Today's Date: 08/18/2017 Time: SLP Start Time (ACUTE ONLY): 1135 -SLP Stop Time (ACUTE ONLY): 1150 SLP Time Calculation (min) (ACUTE ONLY): 15 min Past Medical History: Past Medical History: Diagnosis Date . CAD (coronary artery disease)  . CKD (chronic kidney disease), stage III ([place])   [facility] care everywhere notes (08/13/2017) . COPD (chronic obstructive pulmonary disease) ([place])   /spouse "not aware of this hx" (08/13/2017) . Foot drop, left foot  . Gout  . Headache   "weekly since 06/2015" (08/13/2017) . Hyperlipidemia  . Hypertension  . Myocardial infarction ([place])   /spouse "not aware of this hx" (08/13/2017) . Prostate cancer ([place]) 2007  S/P radiation and OR . Sciatic nerve pain, left  . Subdural hematoma ([place]) 06/12/2017  "no OR; hospitalized for 14 days" (08/13/2017) . Type II diabetes mellitus ([place])   /care everywhere notes from [facility] (08/13/2017);  "tx'd 06/2017 because when he had head trauma, it affected his blood sugar, not tx'd since" (08/13/2017) Past Surgical History: Past Surgical History: Procedure Laterality Date . CATARACT EXTRACTION W/ INTRAOCULAR LENS  IMPLANT, BILATERAL Bilateral 2017 . CORONARY ANGIOPLASTY WITH STENT PLACEMENT   . CYSTOSCOPY W/ URETERAL STENT PLACEMENT Bilateral 08/16/2017  Procedure:  CYSTOSCOPY WITH BILATERAL  RETROGRADE PYELOGRAM LEFT URETERAL STENT EXCHANGE;  Surgeon: Festus Aloe, MD;  Location: [facility];  Service: Urology;  Laterality: Bilateral; . CYSTOSCOPY WITH FULGERATION  multiple  /care everywhere notes [facility] (08/13/2017) . PROSTATECTOMY  2007 . urinary stent  05/2017  [facility]; "has it changed q 3-6 months; last one was placed 05/2017; due to be changed 09/2017 (08/13/2017) HPI: Conall Vangorder is a 77 y.o. male with a past medical history significant for recent CVA, hypertension, CAD status post cardiac stent, type 2 diabetes, CKD-III, peripheral artery disease, ureteral stone, prostate cancer, sciatica, gout who presents today with altered mental status in the setting of urinary tract infection.   Subjective: patient awake sitting in chair in radiology suite, no attempts to speak, attention very poor Assessment / Plan / Recommendation CHL IP CLINICAL IMPRESSIONS 08/18/2017 Clinical Impression Patient exhibited a severe oropharyngeal dysphagia which is likely secondary to current cognitive impairment. Patient required maximal cues to initiate swallow of all consistencies and exhibited delays in swallow initiation to level of vallecular and pyriform sinuses. Full clearance of bolus from pharyngeal cavity was observed when swallow initiated and no residuals remained. Patient exhibited one instance of aspiration of small amount of nectar liquids during the swallow, and patient did not cough.(silent aspiration). Patient likely will be able to return to diet when his cognitive status improves, but currently he is not safe for PO's (except meds in puree).  SLP Visit Diagnosis Dysphagia, oropharyngeal phase (R13.12) Attention and concentration deficit following -- Frontal lobe and executive function deficit following -- Impact on safety and function Severe aspiration risk   CHL IP TREATMENT RECOMMENDATION 08/18/2017 Treatment Recommendations Therapy as outlined in treatment plan  below   Prognosis 08/18/2017 Prognosis for Safe Diet Advancement Good Barriers to Reach Goals Cognitive deficits Barriers/Prognosis Comment -- CHL IP DIET RECOMMENDATION 08/18/2017 SLP Diet Recommendations NPO Liquid Administration via -- Medication Administration Other (Comment) Compensations -- Postural Changes Seated upright at 90 degrees   CHL IP OTHER RECOMMENDATIONS 08/18/2017 Recommended Consults -- Oral Care Recommendations Oral care BID Other Recommendations --   CHL IP FOLLOW UP RECOMMENDATIONS 08/18/2017 Follow up Recommendations Skilled Nursing facility   [facility] IP FREQUENCY AND DURATION 08/18/2017 Speech Therapy Frequency (ACUTE ONLY) min 2x/week Treatment Duration 2 weeks      CHL IP ORAL PHASE 08/18/2017 Oral Phase Impaired Oral - Pudding Teaspoon -- Oral - Pudding Cup -- Oral - Honey Teaspoon -- Oral - Honey Cup Weak lingual manipulation;Reduced posterior propulsion;Delayed oral transit;Holding of bolus Oral - Nectar Teaspoon -- Oral - Nectar Cup Weak lingual manipulation;Delayed oral transit;Holding of bolus;Reduced posterior propulsion Oral - Nectar Straw -- Oral - Thin Teaspoon -- Oral - Thin Cup -- Oral - Thin Straw -- Oral - Puree Weak lingual manipulation;Delayed oral transit;Holding of bolus;Reduced posterior propulsion Oral - Mech Soft -- Oral - Regular -- Oral - Multi-Consistency -- Oral - Pill -- Oral Phase - Comment Oral phase appears to be significantly impacted by cognitive status  CHL IP PHARYNGEAL PHASE 08/18/2017 Pharyngeal Phase Impaired Pharyngeal- Pudding Teaspoon -- Pharyngeal -- Pharyngeal- Pudding Cup -- Pharyngeal -- Pharyngeal- Honey Teaspoon -- Pharyngeal -- Pharyngeal- Honey Cup Delayed swallow initiation-pyriform sinuses;Delayed swallow initiation-vallecula;Other (Comment) Pharyngeal -- Pharyngeal- Nectar Teaspoon -- Pharyngeal -- Pharyngeal- Nectar Cup Penetration/Aspiration during swallow;Delayed swallow initiation-pyriform sinuses;Delayed swallow initiation-vallecula;Reduced  airway/laryngeal closure Pharyngeal Material enters airway, passes BELOW cords without attempt by patient to eject out (silent aspiration) Pharyngeal- Nectar Straw -- Pharyngeal -- Pharyngeal- Thin Teaspoon -- Pharyngeal -- Pharyngeal- Thin Cup -- Pharyngeal -- Pharyngeal- Thin Straw -- Pharyngeal -- Pharyngeal- Puree Delayed swallow initiation-vallecula;Delayed swallow  initiation-pyriform sinuses Pharyngeal -- Pharyngeal- Mechanical Soft -- Pharyngeal -- Pharyngeal- Regular -- Pharyngeal -- Pharyngeal- Multi-consistency -- Pharyngeal -- Pharyngeal- Pill -- Pharyngeal -- Pharyngeal Comment Patient exhibited significant swallow initiation delays and oral holding  CHL IP CERVICAL ESOPHAGEAL PHASE 08/18/2017 Cervical Esophageal Phase WFL Pudding Teaspoon -- Pudding Cup -- Honey Teaspoon -- Honey Cup -- Nectar Teaspoon -- Nectar Cup -- Nectar Straw -- Thin Teaspoon -- Thin Cup -- Thin Straw -- Puree -- Mechanical Soft -- Regular -- Multi-consistency -- Pill -- Cervical Esophageal Comment -- Sonia Baller, MA, CCC-SLP 08/18/17 1:14 PM                Kathrene Alu, MD 08/22/2017, 7:19 AM PGY-1, [place] Intern pager: [phone], text pages welcome

## 2017-08-22 NOTE — Clinical Social Work Note (Signed)
Patient discharged to [facility] this afternoon, transported by ambulance. Wife was at the bedside and aware of discharge.  Nurse was provided with contact information to make report to Hospice nurse.  Lakysha Kossman Givens, MSW, LCSW Licensed Clinical Social Worker [place] [phone]

## 2017-09-27 DEATH — deceased

## 2020-02-18 IMAGING — US US RENAL
1 series · 14 of 25 positions shown · non-contrast
Comparison: CT abdomen pelvis dated August 12, 2017.

CLINICAL DATA: Hydronephrosis.

EXAM:
RENAL / URINARY TRACT ULTRASOUND COMPLETE

[Series 1: us renal · 0.23mm/px · 50 acquisitions, 14 frames shown]
[im 1/50]
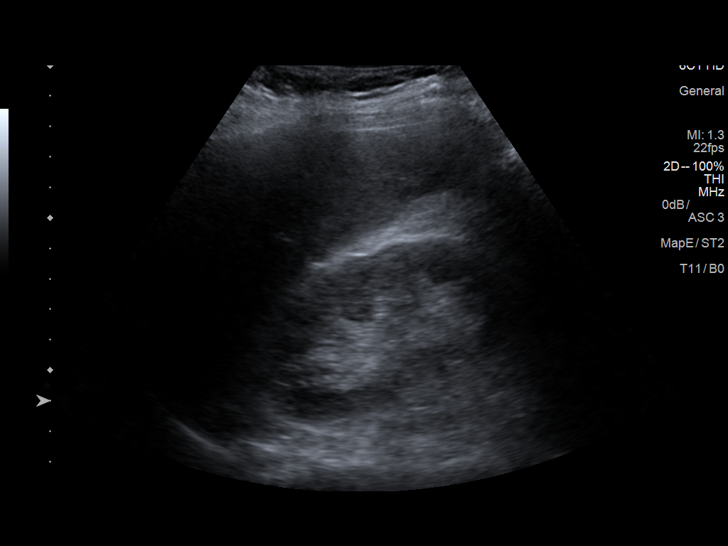
[im 5/50]
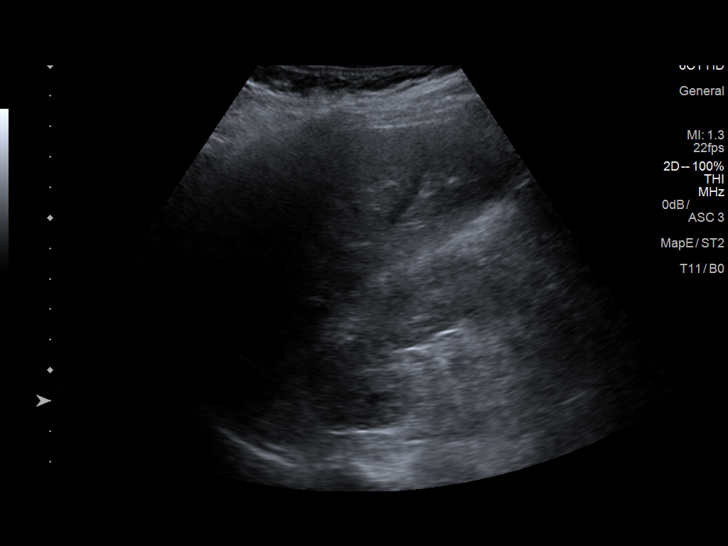
[im 9/50]
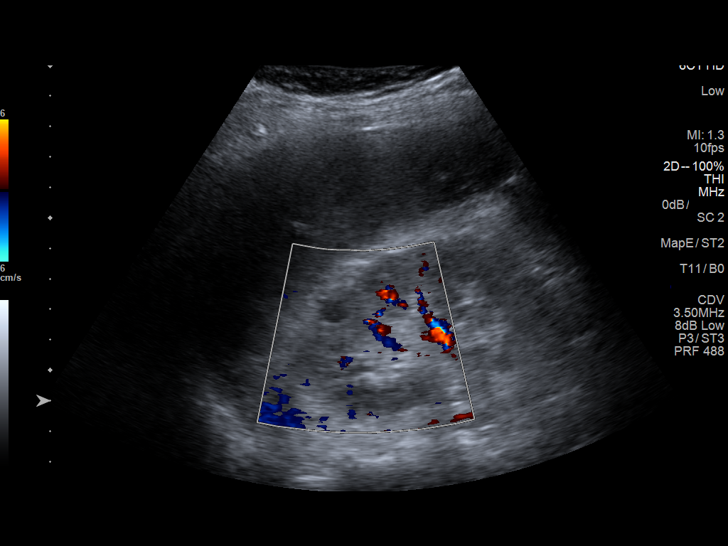
[im 13/50]
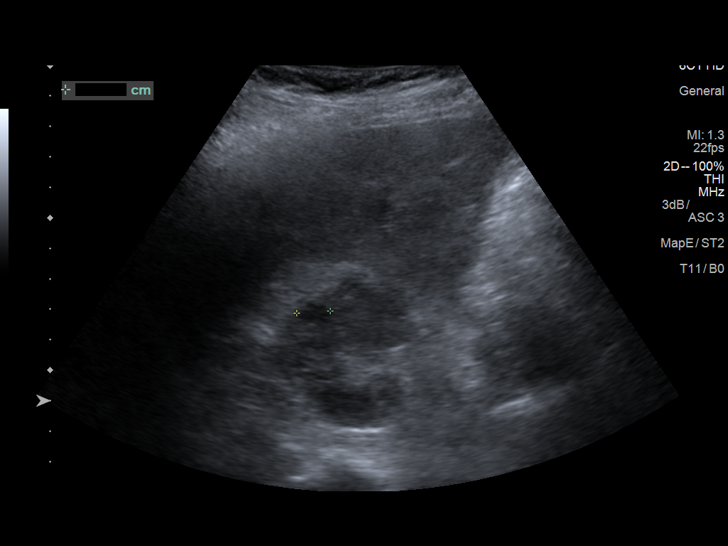
[im 17/50]
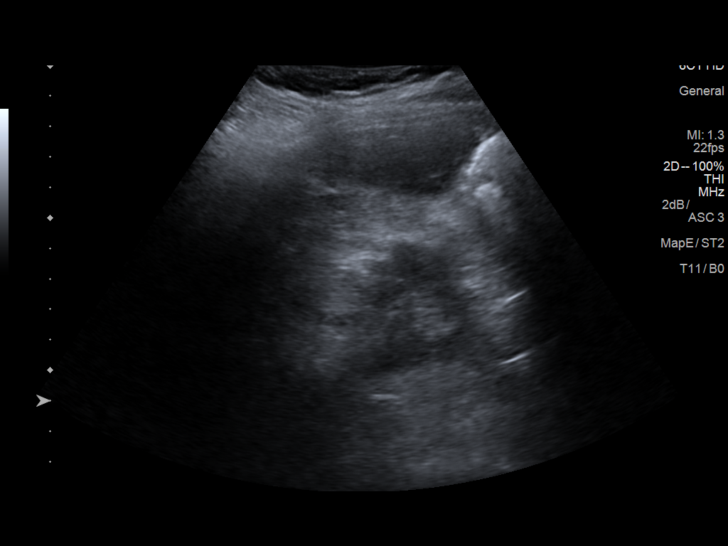
[im 19/50]
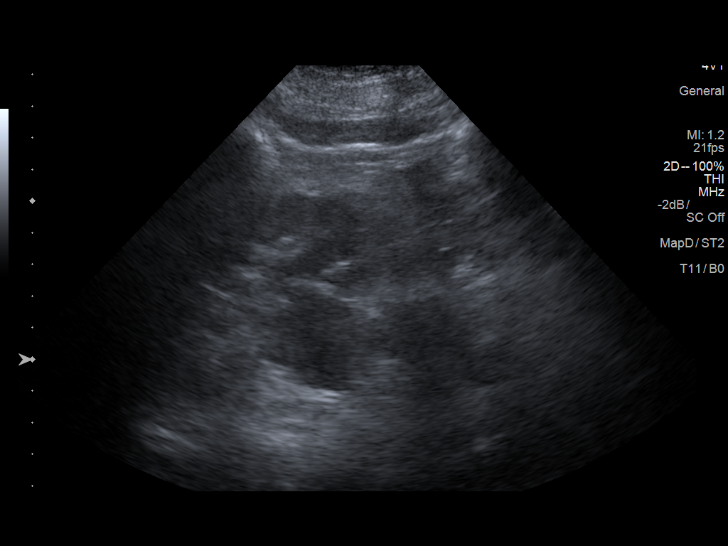
[im 23/50]
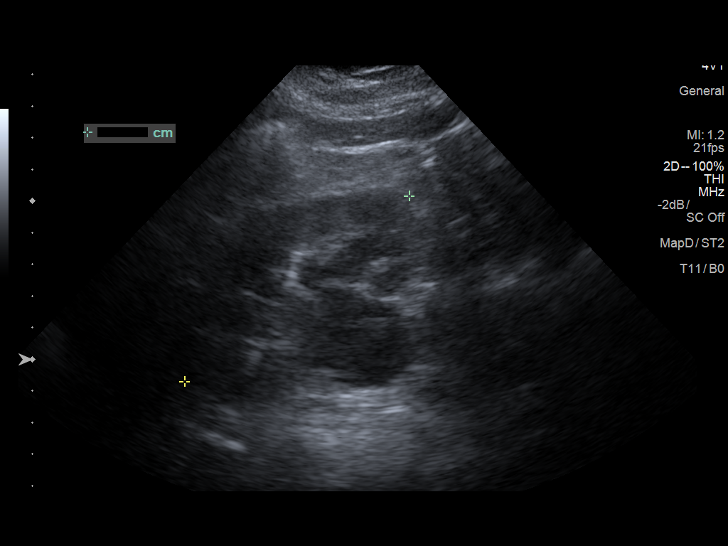
[im 27/50]
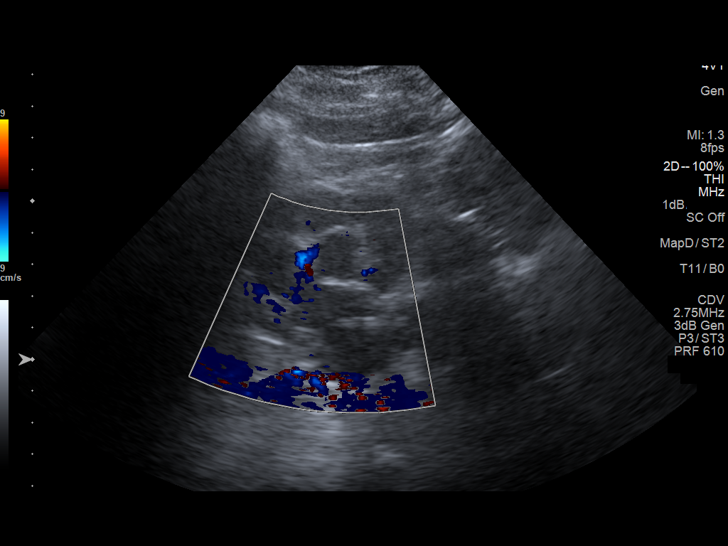
[im 31/50]
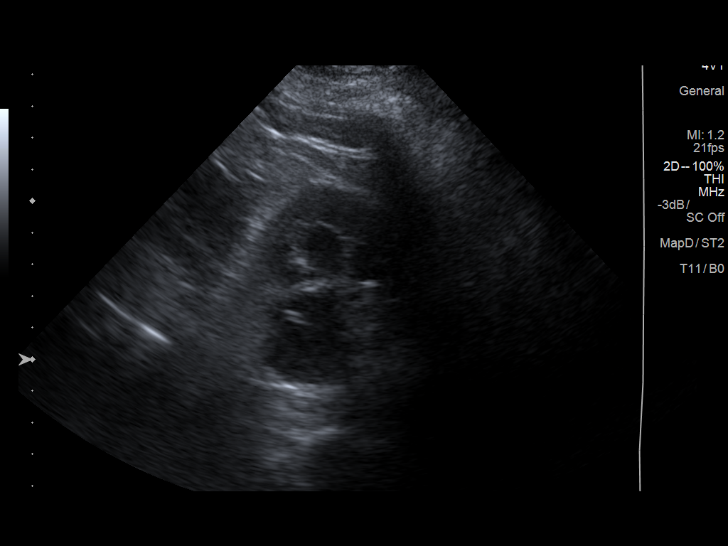
[im 33/50]
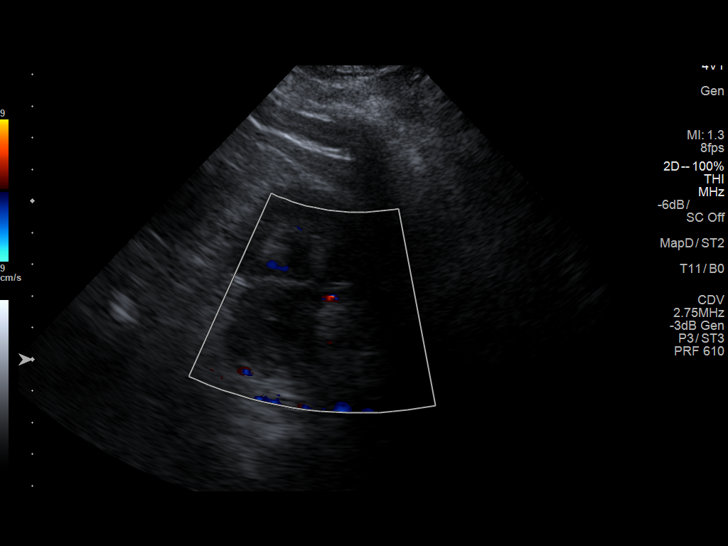
[im 37/50]
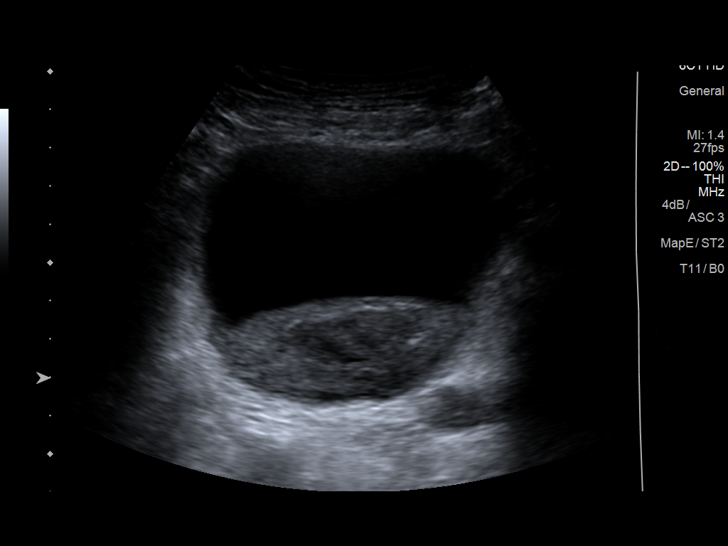
[im 41/50]
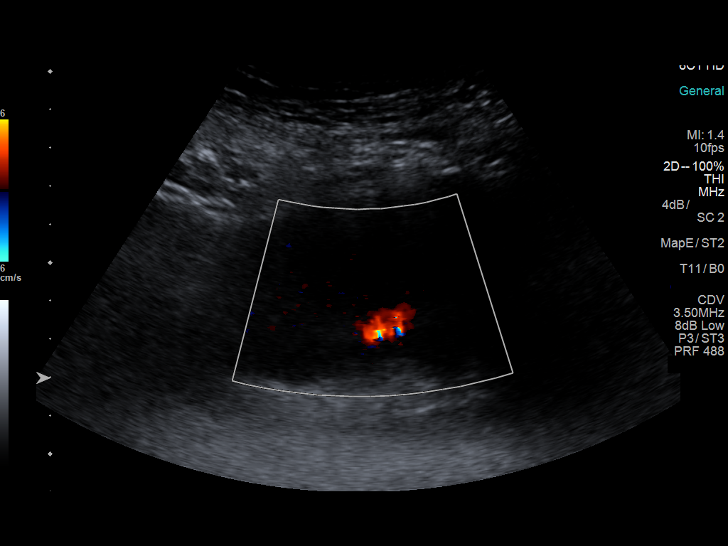
[im 45/50]
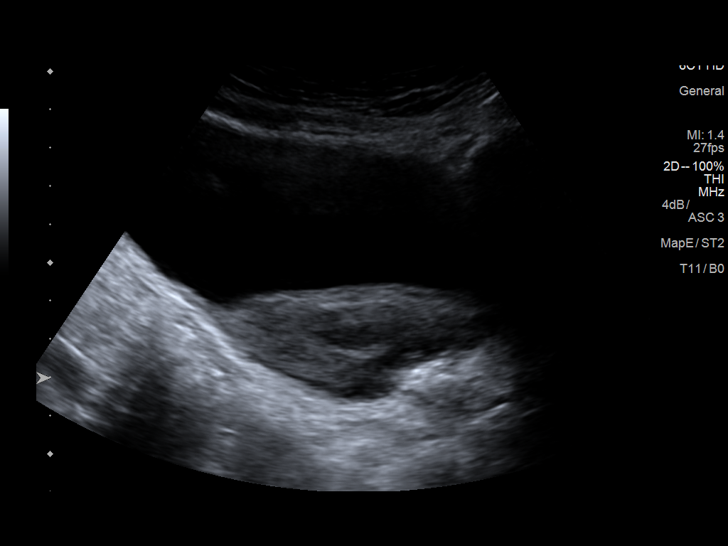
[im 50/50]
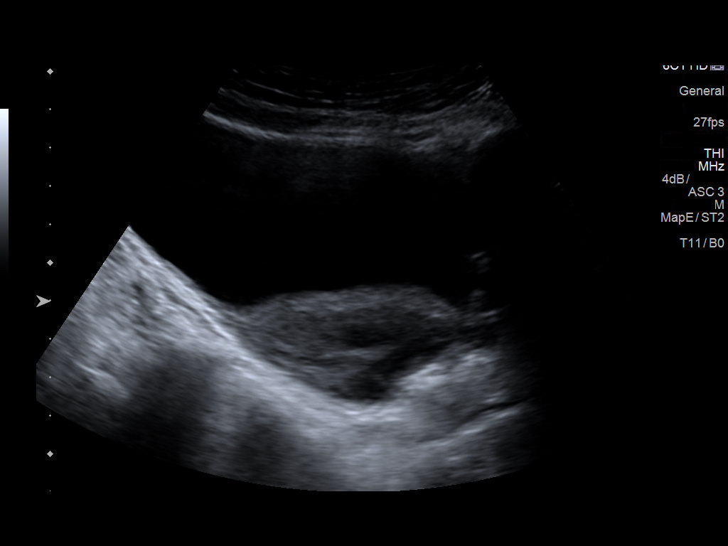

[14 of 25 positions shown; findings below may reference images not displayed]

FINDINGS: Right Kidney:

Length: 9.3 cm. Echogenicity within normal limits. No mass or
hydronephrosis visualized. Small simple cyst measuring 1.2 cm.

Left Kidney:

Length: 9.2 cm. Echogenicity within normal limits. Stent visualized
in the renal pelvis. Mild-to-moderate hydronephrosis, not
significantly changed. No mass visualized.

Bladder:

There is layering, heterogeneously hypoechoic material in the
posterior bladder. There is no internal vascularity. The bilateral
ureteral jets are visualized.
IMPRESSION: 1. Unchanged mild-to-moderate left hydronephrosis. Partially
visualized stent in the left renal pelvis.
2. Layering, heterogeneously hypoechoic material in the posterior
bladder without internal vascularity. While nonspecific, this may
represent hematoma. Consider direct visualization for further
evaluation.
